# Patient Record
Sex: Female | Born: 1975 | Race: Black or African American | Hispanic: No | Marital: Single | Smoking: Current every day smoker
Health system: Southern US, Community
[De-identification: ages and names within clinical notes are randomized; demographics above are authoritative.]

## PROBLEM LIST (undated history)

## (undated) DIAGNOSIS — F419 Anxiety disorder, unspecified: Secondary | ICD-10-CM

## (undated) DIAGNOSIS — F32A Depression, unspecified: Secondary | ICD-10-CM

## (undated) DIAGNOSIS — F329 Major depressive disorder, single episode, unspecified: Secondary | ICD-10-CM

## (undated) HISTORY — PX: DILATION AND CURETTAGE OF UTERUS: SHX78

---

## 2003-08-14 ENCOUNTER — Emergency Department (HOSPITAL_COMMUNITY): Admission: EM | Admit: 2003-08-14 | Discharge: 2003-08-14 | Payer: Self-pay | Admitting: Family Medicine

## 2003-08-28 ENCOUNTER — Emergency Department (HOSPITAL_COMMUNITY): Admission: EM | Admit: 2003-08-28 | Discharge: 2003-08-28 | Payer: Self-pay | Admitting: Family Medicine

## 2003-10-01 ENCOUNTER — Emergency Department (HOSPITAL_COMMUNITY): Admission: EM | Admit: 2003-10-01 | Discharge: 2003-10-01 | Payer: Self-pay | Admitting: *Deleted

## 2004-01-09 ENCOUNTER — Emergency Department (HOSPITAL_COMMUNITY): Admission: EM | Admit: 2004-01-09 | Discharge: 2004-01-09 | Payer: Self-pay | Admitting: Family Medicine

## 2004-03-18 ENCOUNTER — Emergency Department (HOSPITAL_COMMUNITY): Admission: EM | Admit: 2004-03-18 | Discharge: 2004-03-18 | Payer: Self-pay | Admitting: Family Medicine

## 2004-05-20 ENCOUNTER — Emergency Department (HOSPITAL_COMMUNITY): Admission: EM | Admit: 2004-05-20 | Discharge: 2004-05-20 | Payer: Self-pay | Admitting: Family Medicine

## 2005-05-20 ENCOUNTER — Emergency Department (HOSPITAL_COMMUNITY): Admission: EM | Admit: 2005-05-20 | Discharge: 2005-05-20 | Payer: Self-pay | Admitting: Emergency Medicine

## 2005-09-18 ENCOUNTER — Emergency Department (HOSPITAL_COMMUNITY): Admission: EM | Admit: 2005-09-18 | Discharge: 2005-09-18 | Payer: Self-pay | Admitting: Family Medicine

## 2006-01-04 ENCOUNTER — Emergency Department (HOSPITAL_COMMUNITY): Admission: EM | Admit: 2006-01-04 | Discharge: 2006-01-04 | Payer: Self-pay | Admitting: Family Medicine

## 2006-11-08 ENCOUNTER — Emergency Department (HOSPITAL_COMMUNITY): Admission: EM | Admit: 2006-11-08 | Discharge: 2006-11-08 | Payer: Self-pay | Admitting: Emergency Medicine

## 2007-07-27 ENCOUNTER — Emergency Department (HOSPITAL_COMMUNITY): Admission: EM | Admit: 2007-07-27 | Discharge: 2007-07-27 | Payer: Self-pay | Admitting: Family Medicine

## 2010-11-09 ENCOUNTER — Emergency Department (HOSPITAL_COMMUNITY)
Admission: EM | Admit: 2010-11-09 | Discharge: 2010-11-09 | Disposition: A | Discharge status: Home / Self Care | Payer: PRIVATE HEALTH INSURANCE | Attending: Emergency Medicine | Admitting: Emergency Medicine

## 2010-11-09 DIAGNOSIS — J3489 Other specified disorders of nose and nasal sinuses: Secondary | ICD-10-CM | POA: Insufficient documentation

## 2010-11-09 DIAGNOSIS — R51 Headache: Secondary | ICD-10-CM | POA: Insufficient documentation

## 2010-11-09 DIAGNOSIS — J329 Chronic sinusitis, unspecified: Secondary | ICD-10-CM | POA: Insufficient documentation

## 2010-11-09 LAB — URINALYSIS, ROUTINE W REFLEX MICROSCOPIC
Leukocytes, UA: NEGATIVE
Nitrite: NEGATIVE
Specific Gravity, Urine: 1.009 (ref 1.005–1.030)
Urobilinogen, UA: 0.2 mg/dL (ref 0.0–1.0)
pH: 7.5 (ref 5.0–8.0)

## 2010-11-09 LAB — CBC
Platelets: 256 10*3/uL (ref 150–400)
RBC: 3.81 MIL/uL — ABNORMAL LOW (ref 3.87–5.11)
RDW: 12.5 % (ref 11.5–15.5)
WBC: 5.2 10*3/uL (ref 4.0–10.5)

## 2010-11-09 LAB — DIFFERENTIAL
Basophils Absolute: 0 10*3/uL (ref 0.0–0.1)
Eosinophils Absolute: 0.1 10*3/uL (ref 0.0–0.7)
Eosinophils Relative: 1 % (ref 0–5)
Lymphs Abs: 2.5 10*3/uL (ref 0.7–4.0)
Neutrophils Relative %: 44 % (ref 43–77)

## 2010-11-09 LAB — BASIC METABOLIC PANEL
BUN: 4 mg/dL — ABNORMAL LOW (ref 6–23)
Chloride: 95 mEq/L — ABNORMAL LOW (ref 96–112)
Potassium: 3.6 mEq/L (ref 3.5–5.1)
Sodium: 132 mEq/L — ABNORMAL LOW (ref 135–145)

## 2010-11-09 LAB — POCT PREGNANCY, URINE: Preg Test, Ur: NEGATIVE

## 2011-02-01 LAB — POCT URINALYSIS DIP (DEVICE)
Bilirubin Urine: NEGATIVE
Glucose, UA: NEGATIVE
Nitrite: NEGATIVE
Operator id: 247071
pH: 7

## 2011-02-01 LAB — WET PREP, GENITAL
Clue Cells Wet Prep HPF POC: NONE SEEN
Yeast Wet Prep HPF POC: NONE SEEN

## 2011-12-02 ENCOUNTER — Encounter (HOSPITAL_COMMUNITY): Payer: Self-pay | Admitting: *Deleted

## 2011-12-02 ENCOUNTER — Emergency Department (HOSPITAL_COMMUNITY)
Admission: EM | Admit: 2011-12-02 | Discharge: 2011-12-03 | Disposition: A | Discharge status: Home / Self Care | Payer: Non-veteran care | Attending: Emergency Medicine | Admitting: Emergency Medicine

## 2011-12-02 ENCOUNTER — Emergency Department (HOSPITAL_COMMUNITY): Payer: Non-veteran care

## 2011-12-02 DIAGNOSIS — R109 Unspecified abdominal pain: Secondary | ICD-10-CM | POA: Insufficient documentation

## 2011-12-02 DIAGNOSIS — K625 Hemorrhage of anus and rectum: Secondary | ICD-10-CM

## 2011-12-02 MED ORDER — HYDROMORPHONE HCL PF 1 MG/ML IJ SOLN
1.0000 mg | Freq: Once | INTRAMUSCULAR | Status: DC
  Filled 2011-12-02: qty 1

## 2011-12-02 MED ORDER — ONDANSETRON HCL 4 MG/2ML IJ SOLN
4.0000 mg | Freq: Once | INTRAMUSCULAR | Status: AC
  Administered 2011-12-02: 4 mg via INTRAVENOUS
  Filled 2011-12-02: qty 2

## 2011-12-02 MED ORDER — SODIUM CHLORIDE 0.9 % IV BOLUS (SEPSIS)
1000.0000 mL | Freq: Once | INTRAVENOUS | Status: AC
  Administered 2011-12-02: 1000 mL via INTRAVENOUS

## 2011-12-02 MED ORDER — KETOROLAC TROMETHAMINE 30 MG/ML IJ SOLN
30.0000 mg | Freq: Once | INTRAMUSCULAR | Status: AC
  Administered 2011-12-02: 30 mg via INTRAVENOUS
  Filled 2011-12-02: qty 1

## 2011-12-02 NOTE — ED Notes (Signed)
Nurse visualized stool. Bright red blood with little feces.

## 2011-12-02 NOTE — ED Notes (Signed)
Pt did hydrogen peroxide enema around 7pm; used 3 tbsp hydrogen peroxide to one quart of water; pt had bowel movement x 1 hour; woke up with abd pain; developed bright red blood in stool prior to arrival; hurts to have bowel movement

## 2011-12-03 LAB — CBC WITH DIFFERENTIAL/PLATELET
Basophils Absolute: 0 10*3/uL (ref 0.0–0.1)
Eosinophils Relative: 0 % (ref 0–5)
HCT: 35.3 % — ABNORMAL LOW (ref 36.0–46.0)
Lymphocytes Relative: 5 % — ABNORMAL LOW (ref 12–46)
Lymphs Abs: 0.4 10*3/uL — ABNORMAL LOW (ref 0.7–4.0)
MCH: 28.1 pg (ref 26.0–34.0)
MCV: 84.7 fL (ref 78.0–100.0)
Monocytes Absolute: 0.4 10*3/uL (ref 0.1–1.0)
RDW: 12.5 % (ref 11.5–15.5)
WBC: 9.8 10*3/uL (ref 4.0–10.5)

## 2011-12-03 LAB — COMPREHENSIVE METABOLIC PANEL
ALT: 10 U/L (ref 0–35)
AST: 16 U/L (ref 0–37)
Albumin: 4.2 g/dL (ref 3.5–5.2)
Alkaline Phosphatase: 41 U/L (ref 39–117)
BUN: 11 mg/dL (ref 6–23)
Chloride: 103 mEq/L (ref 96–112)
Potassium: 3.8 mEq/L (ref 3.5–5.1)
Sodium: 136 mEq/L (ref 135–145)
Total Bilirubin: 0.2 mg/dL — ABNORMAL LOW (ref 0.3–1.2)
Total Protein: 6.8 g/dL (ref 6.0–8.3)

## 2011-12-03 MED ORDER — IBUPROFEN 800 MG PO TABS: 800.0000 mg | ORAL_TABLET | Freq: Three times a day (TID) | ORAL | Status: AC

## 2011-12-03 MED ORDER — HYDROCODONE-ACETAMINOPHEN 5-325 MG PO TABS: 1.0000 | ORAL_TABLET | Freq: Four times a day (QID) | ORAL | Status: AC | PRN

## 2011-12-03 NOTE — ED Provider Notes (Signed)
History     CSN: [id number]  Arrival date & time 12/02/11  2200   First MD Initiated Contact with Patient 12/02/11 2301      Chief Complaint  Patient presents with  . Abdominal Pain    (Consider location/radiation/quality/duration/timing/severity/associated sxs/prior treatment) Patient is a 36 y.o. female presenting with abdominal pain. The history is provided by the patient (the pt gave herself an enema and now has abd pain and bleeding). No language interpreter was used.  Abdominal Pain The primary symptoms of the illness include abdominal pain. The primary symptoms of the illness do not include fatigue or diarrhea. The current episode started 3 to 5 hours ago. The onset of the illness was sudden. The problem has been gradually improving.  Associated with: nothing. The patient states that she believes she is currently not pregnant. The patient has not had a change in bowel habit. Risk factors: none. Symptoms associated with the illness do not include chills, hematuria, frequency or back pain.    History reviewed. No pertinent past medical history.  History reviewed. No pertinent past surgical history.  No family history on file.  History  Substance Use Topics  . Smoking status: Never Smoker   . Smokeless tobacco: Not on file  . Alcohol Use:     OB History    Grav Para Term Preterm Abortions TAB SAB Ect Mult Living                  Review of Systems  Constitutional: Negative for chills and fatigue.  HENT: Negative for congestion, sinus pressure and ear discharge.   Eyes: Negative for discharge.  Respiratory: Negative for cough.   Cardiovascular: Negative for chest pain.  Gastrointestinal: Positive for abdominal pain. Negative for diarrhea.       Rectal bleeding  Genitourinary: Negative for frequency and hematuria.  Musculoskeletal: Negative for back pain.  Skin: Negative for rash.  Neurological: Negative for seizures and headaches.  Hematological: Negative.     Psychiatric/Behavioral: Negative for hallucinations.    Allergies  Review of patient's allergies indicates no known allergies.  Home Medications   Current Outpatient Rx  Name Route Sig Dispense Refill  . OVER THE COUNTER MEDICATION Oral Take 1 capsule by mouth. OTC. Herbal Supplement for constapation.    Marland Kitchen HYDROCODONE-ACETAMINOPHEN 5-325 MG PO TABS Oral Take 1 tablet by mouth every 6 (six) hours as needed for pain. 20 tablet 0    BP 97/61  Pulse 84  Temp 98.1 F (36.7 C) (Oral)  Resp 18  SpO2 100%  LMP 12/02/2011  Physical Exam  Constitutional: She is oriented to person, place, and time. She appears well-developed.  HENT:  Head: Normocephalic and atraumatic.  Eyes: Conjunctivae and EOM are normal. No scleral icterus.  Neck: Neck supple. No thyromegaly present.  Cardiovascular: Normal rate and regular rhythm.  Exam reveals no gallop and no friction rub.   No murmur heard. Pulmonary/Chest: No stridor. She has no wheezes. She has no rales. She exhibits no tenderness.  Abdominal: She exhibits no distension. There is tenderness. There is no rebound.       Mild llq tenderness  Musculoskeletal: Normal range of motion. She exhibits no edema.  Lymphadenopathy:    She has no cervical adenopathy.  Neurological: She is oriented to person, place, and time. Coordination normal.  Skin: No rash noted. No erythema.  Psychiatric: She has a normal mood and affect. Her behavior is normal.    ED Course  Procedures (including critical care time)  Labs Reviewed  CBC WITH DIFFERENTIAL - Abnormal; Notable for the following:    Hemoglobin 11.7 (*)     HCT 35.3 (*)     Neutrophils Relative 91 (*)     Neutro Abs 8.9 (*)     Lymphocytes Relative 5 (*)     Lymphs Abs 0.4 (*)     All other components within normal limits  COMPREHENSIVE METABOLIC PANEL - Abnormal; Notable for the following:    Total Bilirubin 0.2 (*)     All other components within normal limits   Dg Abd Acute  W/chest  12/02/2011  *RADIOLOGY REPORT*  Clinical Data: Constipation.  Low abdominal pain.  ACUTE ABDOMEN SERIES (ABDOMEN 2 VIEW & CHEST 1 VIEW)  Comparison: None.  Findings: The heart size and mediastinal contours are normal. The lungs are clear. There is no pleural effusion or pneumothorax. No acute osseous findings are identified.  Colonic stool does not appear significantly increased.  The bowel gas pattern is normal.  There are bilateral pelvic phleboliths.  No pneumoperitoneum or suspicious calcification is identified. Osseous structures appear normal.  IMPRESSION: No active cardiopulmonary or abdominal process identified.  Original Report Authenticated By: Gerrianne Scale, M.D.     1. Rectal bleeding       MDM          Benny Lennert, MD 12/03/11 [phone]

## 2012-12-20 ENCOUNTER — Emergency Department (HOSPITAL_BASED_OUTPATIENT_CLINIC_OR_DEPARTMENT_OTHER): Payer: Non-veteran care

## 2012-12-20 ENCOUNTER — Encounter (HOSPITAL_BASED_OUTPATIENT_CLINIC_OR_DEPARTMENT_OTHER): Payer: Self-pay | Admitting: *Deleted

## 2012-12-20 ENCOUNTER — Emergency Department (HOSPITAL_BASED_OUTPATIENT_CLINIC_OR_DEPARTMENT_OTHER)
Admission: EM | Admit: 2012-12-20 | Discharge: 2012-12-20 | Disposition: A | Discharge status: Home / Self Care | Payer: Non-veteran care | Attending: Emergency Medicine | Admitting: Emergency Medicine

## 2012-12-20 DIAGNOSIS — R202 Paresthesia of skin: Secondary | ICD-10-CM

## 2012-12-20 DIAGNOSIS — E119 Type 2 diabetes mellitus without complications: Secondary | ICD-10-CM | POA: Insufficient documentation

## 2012-12-20 DIAGNOSIS — F17200 Nicotine dependence, unspecified, uncomplicated: Secondary | ICD-10-CM | POA: Insufficient documentation

## 2012-12-20 DIAGNOSIS — R5381 Other malaise: Secondary | ICD-10-CM | POA: Insufficient documentation

## 2012-12-20 DIAGNOSIS — Z8659 Personal history of other mental and behavioral disorders: Secondary | ICD-10-CM | POA: Insufficient documentation

## 2012-12-20 DIAGNOSIS — R5383 Other fatigue: Secondary | ICD-10-CM | POA: Insufficient documentation

## 2012-12-20 DIAGNOSIS — R209 Unspecified disturbances of skin sensation: Secondary | ICD-10-CM | POA: Insufficient documentation

## 2012-12-20 HISTORY — DX: Major depressive disorder, single episode, unspecified: F32.9

## 2012-12-20 HISTORY — DX: Depression, unspecified: F32.A

## 2012-12-20 HISTORY — DX: Anxiety disorder, unspecified: F41.9

## 2012-12-20 LAB — BASIC METABOLIC PANEL
CO2: 22 mEq/L (ref 19–32)
Calcium: 10 mg/dL (ref 8.4–10.5)
Creatinine, Ser: 0.6 mg/dL (ref 0.50–1.10)
GFR calc non Af Amer: >90 mL/min (ref >90)
Sodium: 137 mEq/L (ref 135–145)

## 2012-12-20 LAB — CBC WITH DIFFERENTIAL/PLATELET
Basophils Absolute: 0.1 10*3/uL (ref 0.0–0.1)
Eosinophils Absolute: 0.1 10*3/uL (ref 0.0–0.7)
Eosinophils Relative: 1 % (ref 0–5)
HCT: 33.8 % — ABNORMAL LOW (ref 36.0–46.0)
Lymphocytes Relative: 30 % (ref 12–46)
MCH: 29 pg (ref 26.0–34.0)
MCHC: 34.6 g/dL (ref 30.0–36.0)
MCV: 83.9 fL (ref 78.0–100.0)
Monocytes Absolute: 0.7 10*3/uL (ref 0.1–1.0)
Platelets: 234 10*3/uL (ref 150–400)
RDW: 12.9 % (ref 11.5–15.5)

## 2012-12-20 MED ORDER — TRAMADOL HCL 50 MG PO TABS: 50.0000 mg | ORAL_TABLET | Freq: Four times a day (QID) | ORAL | Status: DC | PRN

## 2012-12-20 MED ORDER — IBUPROFEN 800 MG PO TABS
800.0000 mg | ORAL_TABLET | Freq: Once | ORAL | Status: AC
  Administered 2012-12-20: 800 mg via ORAL
  Filled 2012-12-20: qty 1

## 2012-12-20 MED ORDER — TRAMADOL HCL 50 MG PO TABS
50.0000 mg | ORAL_TABLET | Freq: Once | ORAL | Status: AC
  Administered 2012-12-20: 50 mg via ORAL
  Filled 2012-12-20: qty 1

## 2012-12-20 NOTE — ED Notes (Signed)
Pt reports that she started having right sided numbness about 1 week ago. Pt also reports memory loss and weakness to the right leg.

## 2012-12-20 NOTE — ED Notes (Signed)
Pt returned from CT. Ambulating unassisted and with steady gait.

## 2012-12-20 NOTE — ED Provider Notes (Signed)
CSN: [id number]     Arrival date & time 12/20/12  1818 History     First MD Initiated Contact with Patient 12/20/12 1908     Chief Complaint  Patient presents with  . Numbness   (Consider location/radiation/quality/duration/timing/severity/associated sxs/prior Treatment) HPI Comments: Patient is a 37 year old female with a past medical history of anxiety, depression, and diabetes who presents with a 1 week history of right side numbness and weakness. Symptoms started gradually and remained constant since the onset. Patient further describes the paresthesias of the right posterior aspect of neck, right lower back, and lateral right lower leg. Patient became concerned because her right leg "gave out" on her while at work today. Patient has not tried anything for symptoms. No aggravating/alleviating factors. Patient denies family history of MS.    Past Medical History  Diagnosis Date  . Anxiety   . Depression   . Diabetes mellitus without complication    Past Surgical History  Procedure Laterality Date  . Dilation and curettage of uterus     History reviewed. No pertinent family history. History  Substance Use Topics  . Smoking status: Current Every Day Smoker -- 0.50 packs/day  . Smokeless tobacco: Not on file  . Alcohol Use: No   OB History   Grav Para Term Preterm Abortions TAB SAB Ect Mult Living                 Review of Systems  Neurological: Positive for weakness and numbness.  All other systems reviewed and are negative.    Allergies  Review of patient's allergies indicates no known allergies.  Home Medications   Current Outpatient Rx  Name  Route  Sig  Dispense  Refill  . OVER THE COUNTER MEDICATION   Oral   Take 1 capsule by mouth. OTC. Herbal Supplement for constapation.          BP 143/87  Pulse 91  Temp(Src) 98.9 F (37.2 C) (Oral)  Resp 20  Ht 5\' 4"  (1.626 m)  Wt 110 lb (49.896 kg)  BMI 18.87 kg/m2  SpO2 100%  LMP 11/26/2012 Physical Exam   Nursing note and vitals reviewed. Constitutional: She is oriented to person, place, and time. She appears well-developed and well-nourished. No distress.  HENT:  Head: Normocephalic and atraumatic.  Mouth/Throat: Oropharynx is clear and moist. No oropharyngeal exudate.  Eyes: Conjunctivae and EOM are normal.  Neck: Normal range of motion.  Cardiovascular: Normal rate and regular rhythm.  Exam reveals no gallop and no friction rub.   No murmur heard. Pulmonary/Chest: Effort normal and breath sounds normal. She has no wheezes. She has no rales. She exhibits no tenderness.  Abdominal: Soft. She exhibits no distension. There is no tenderness. There is no rebound and no guarding.  Musculoskeletal: Normal range of motion.  Neurological: She is alert and oriented to person, place, and time. No cranial nerve deficit. Coordination normal.  Right lateral leg diminished sensation. Extremity strength equal and intact bilaterally. Speech is goal-oriented. Moves limbs without ataxia.   Skin: Skin is warm and dry.  Psychiatric: She has a normal mood and affect. Her behavior is normal.    ED Course   Procedures (including critical care time)  Labs Reviewed  CBC WITH DIFFERENTIAL - Abnormal; Notable for the following:    Hemoglobin 11.7 (*)    HCT 33.8 (*)    All other components within normal limits  BASIC METABOLIC PANEL - Abnormal; Notable for the following:    Glucose,  Bld 112 (*)    All other components within normal limits   Ct Head Wo Contrast  12/20/2012   *RADIOLOGY REPORT*  Clinical Data: Right-sided facial numbness  CT HEAD WITHOUT CONTRAST  Technique:  Contiguous axial images were obtained from the base of the skull through the vertex without contrast.  Comparison: 11/08/2006  Findings: No acute hemorrhage, acute infarction, or mass lesion is identified.  No midline shift.  No ventriculomegaly.  No skull fracture.  Orbits and paranasal sinuses are intact.  No soft tissue abnormality.   IMPRESSION: No acute intracranial finding.  No change.   Original Report Authenticated By: Christiana Pellant, M.D.   1. Paresthesias with subjective weakness     MDM  7:19 PM Patient will have labs, head CT and ibuprofen for back pain. Vitals stable and patient afebrile. Patient has diminished sensation over lateral right leg at this time without other neuro deficits.   8:50 PM Patient's labs and heat CT unremarkable for acute changes. I'm concerned for MS in the patient. I spoke with Dr. Thad Ranger with Neurology about a follow up plan for the patient since MRI is not available at [facility] tonight. Dr. Thad Ranger is OK with the patient having an MRI as an outpatient at the [facility]. If the patient is unable to follow up in a reasonable time frame, patient will go to [facility]. Patient is ok with outpatient follow up. Patient not having weakness or bladder/bowel incontinence. Vitals stable and patient afebrile. Patience Musca, PA-C 12/20/12 2122

## 2012-12-20 NOTE — ED Notes (Signed)
Patient transported to CT 

## 2012-12-21 NOTE — ED Provider Notes (Signed)
Medical screening examination/treatment/procedure(s) were conducted as a shared visit with non-physician practitioner(s) and myself.  I personally evaluated the patient during the encounter  RLE paresthesias, R shoulder paresthesias.  CN 2-12 intact, no ataxia on finger to nose, no nystagmus, 5/5 strength throughout, no pronator drift, Romberg negative, normal gait. Consider MS.  Glynn Octave, MD 12/21/12 [phone]

## 2013-04-11 ENCOUNTER — Emergency Department (HOSPITAL_BASED_OUTPATIENT_CLINIC_OR_DEPARTMENT_OTHER)
Admission: EM | Admit: 2013-04-11 | Discharge: 2013-04-11 | Disposition: A | Discharge status: Home / Self Care | Payer: Non-veteran care | Attending: Emergency Medicine | Admitting: Emergency Medicine

## 2013-04-11 ENCOUNTER — Encounter (HOSPITAL_BASED_OUTPATIENT_CLINIC_OR_DEPARTMENT_OTHER): Payer: Self-pay | Admitting: Emergency Medicine

## 2013-04-11 DIAGNOSIS — F17200 Nicotine dependence, unspecified, uncomplicated: Secondary | ICD-10-CM | POA: Insufficient documentation

## 2013-04-11 DIAGNOSIS — Z3202 Encounter for pregnancy test, result negative: Secondary | ICD-10-CM | POA: Insufficient documentation

## 2013-04-11 DIAGNOSIS — R5381 Other malaise: Secondary | ICD-10-CM | POA: Insufficient documentation

## 2013-04-11 DIAGNOSIS — Z8659 Personal history of other mental and behavioral disorders: Secondary | ICD-10-CM | POA: Insufficient documentation

## 2013-04-11 DIAGNOSIS — R42 Dizziness and giddiness: Secondary | ICD-10-CM | POA: Insufficient documentation

## 2013-04-11 LAB — CBC WITH DIFFERENTIAL/PLATELET
Eosinophils Absolute: 0 10*3/uL (ref 0.0–0.7)
Lymphocytes Relative: 14 % (ref 12–46)
Lymphs Abs: 1.2 10*3/uL (ref 0.7–4.0)
MCH: 28.9 pg (ref 26.0–34.0)
MCV: 85.8 fL (ref 78.0–100.0)
Neutro Abs: 6.9 10*3/uL (ref 1.7–7.7)
Neutrophils Relative %: 80 % — ABNORMAL HIGH (ref 43–77)
Platelets: 248 10*3/uL (ref 150–400)
RBC: 4.36 MIL/uL (ref 3.87–5.11)
WBC: 8.6 10*3/uL (ref 4.0–10.5)

## 2013-04-11 LAB — BASIC METABOLIC PANEL
CO2: 24 mEq/L (ref 19–32)
Chloride: 96 mEq/L (ref 96–112)
Creatinine, Ser: 0.6 mg/dL (ref 0.50–1.10)
GFR calc Af Amer: >90 mL/min (ref >90)
GFR calc non Af Amer: >90 mL/min (ref >90)
Glucose, Bld: 74 mg/dL (ref 70–99)
Potassium: 4.1 mEq/L (ref 3.5–5.1)
Sodium: 132 mEq/L — ABNORMAL LOW (ref 135–145)

## 2013-04-11 LAB — URINALYSIS, ROUTINE W REFLEX MICROSCOPIC
Glucose, UA: NEGATIVE mg/dL
Hgb urine dipstick: NEGATIVE
Ketones, ur: 15 mg/dL — AB
Leukocytes, UA: NEGATIVE
Protein, ur: NEGATIVE mg/dL
Urobilinogen, UA: 0.2 mg/dL (ref 0.0–1.0)

## 2013-04-11 LAB — PREGNANCY, URINE: Preg Test, Ur: NEGATIVE

## 2013-04-11 MED ORDER — SODIUM CHLORIDE 0.9 % IV SOLN
Freq: Once | INTRAVENOUS | Status: AC
  Administered 2013-04-11: 16:00:00 via INTRAVENOUS

## 2013-04-11 NOTE — ED Provider Notes (Signed)
Medical screening examination/treatment/procedure(s) were performed by non-physician practitioner and as supervising physician I was immediately available for consultation/collaboration.  EKG Interpretation   None         Josilynn Losh, MD 04/11/13 2345 

## 2013-04-11 NOTE — ED Provider Notes (Signed)
CSN: [id number]     Arrival date & time 04/11/13  1437 History   First MD Initiated Contact with Patient 04/11/13 1534     Chief Complaint  Patient presents with  . Dizziness   (Consider location/radiation/quality/duration/timing/severity/associated sxs/prior Treatment) Patient is a 37 y.o. female presenting with weakness. The history is provided by the patient. No language interpreter was used.  Weakness This is a new problem. The current episode started today. The problem occurs constantly. The problem has been unchanged. Associated symptoms include weakness. Nothing aggravates the symptoms. She has tried nothing for the symptoms. The treatment provided moderate relief.   Pt reports she felt dizziness today at work.  Pt reports she felt like she was going to pass out.  Past Medical History  Diagnosis Date  . Anxiety   . Depression    Past Surgical History  Procedure Laterality Date  . Dilation and curettage of uterus     No family history on file. History  Substance Use Topics  . Smoking status: Current Every Day Smoker -- 0.50 packs/day  . Smokeless tobacco: Not on file  . Alcohol Use: No   OB History   Grav Para Term Preterm Abortions TAB SAB Ect Mult Living                 Review of Systems  Neurological: Positive for weakness.  All other systems reviewed and are negative.    Allergies  Review of patient's allergies indicates no known allergies.  Home Medications  No current outpatient prescriptions on file. BP 127/90  Pulse 99  Temp(Src) 98.6 F (37 C) (Oral)  Resp 16  Ht 5\' 4"  (1.626 m)  Wt 115 lb (52.164 kg)  BMI 19.73 kg/m2  SpO2 100%  LMP 04/02/2013 Physical Exam  Nursing note and vitals reviewed. Constitutional: She appears well-developed and well-nourished.  HENT:  Head: Normocephalic.  Right Ear: External ear normal.  Left Ear: External ear normal.  Eyes: Pupils are equal, round, and reactive to light.  Neck: Normal range of motion.   Cardiovascular: Normal rate, regular rhythm and normal heart sounds.   Pulmonary/Chest: Effort normal and breath sounds normal.  Abdominal: Soft. Bowel sounds are normal.  Musculoskeletal: Normal range of motion.  Neurological: She is alert.  Skin: Skin is warm.  Psychiatric: She has a normal mood and affect.    ED Course  Procedures (including critical care time) Labs Review Labs Reviewed  URINALYSIS, ROUTINE W REFLEX MICROSCOPIC  PREGNANCY, URINE  CBC WITH DIFFERENTIAL  BASIC METABOLIC PANEL   Imaging Review No results found.  EKG Interpretation   None       MDM  No diagnosis found. Results for orders placed during the hospital encounter of 04/11/13  URINALYSIS, ROUTINE W REFLEX MICROSCOPIC      Result Value Range   Color, Urine YELLOW  YELLOW   APPearance CLEAR  CLEAR   Specific Gravity, Urine 1.011  1.005 - 1.030   pH 6.0  5.0 - 8.0   Glucose, UA NEGATIVE  NEGATIVE mg/dL   Hgb urine dipstick NEGATIVE  NEGATIVE   Bilirubin Urine NEGATIVE  NEGATIVE   Ketones, ur 15 (*) NEGATIVE mg/dL   Protein, ur NEGATIVE  NEGATIVE mg/dL   Urobilinogen, UA 0.2  0.0 - 1.0 mg/dL   Nitrite NEGATIVE  NEGATIVE   Leukocytes, UA NEGATIVE  NEGATIVE  PREGNANCY, URINE      Result Value Range   Preg Test, Ur NEGATIVE  NEGATIVE  CBC WITH DIFFERENTIAL  Result Value Range   WBC 8.6  4.0 - 10.5 K/uL   RBC 4.36  3.87 - 5.11 MIL/uL   Hemoglobin 12.6  12.0 - 15.0 g/dL   HCT 62.9  52.8 - 41.3 %   MCV 85.8  78.0 - 100.0 fL   MCH 28.9  26.0 - 34.0 pg   MCHC 33.7  30.0 - 36.0 g/dL   RDW 24.4  01.0 - 27.2 %   Platelets 248  150 - 400 K/uL   Neutrophils Relative % 80 (*) 43 - 77 %   Neutro Abs 6.9  1.7 - 7.7 K/uL   Lymphocytes Relative 14  12 - 46 %   Lymphs Abs 1.2  0.7 - 4.0 K/uL   Monocytes Relative 6  3 - 12 %   Monocytes Absolute 0.5  0.1 - 1.0 K/uL   Eosinophils Relative 0  0 - 5 %   Eosinophils Absolute 0.0  0.0 - 0.7 K/uL   Basophils Relative 1  0 - 1 %   Basophils Absolute  0.0  0.0 - 0.1 K/uL  BASIC METABOLIC PANEL      Result Value Range   Sodium 132 (*) 135 - 145 mEq/L   Potassium 4.1  3.5 - 5.1 mEq/L   Chloride 96  96 - 112 mEq/L   CO2 24  19 - 32 mEq/L   Glucose, Bld 74  70 - 99 mg/dL   BUN 5 (*) 6 - 23 mg/dL   Creatinine, Ser 5.36  0.50 - 1.10 mg/dL   Calcium 9.6  8.4 - 64.4 mg/dL   GFR calc non Af Amer >90  >90 mL/min   GFR calc Af Amer >90  >90 mL/min   No results found.   Pt given Iv fluids x one liter,   Labs reviewed.   Pt advised recheck with her MD.  Elson Areas, PA-C 04/11/13 1703

## 2013-04-11 NOTE — ED Notes (Signed)
Pt c/o dizziness since Saturday.  Felt somewhat better Sunday but symptoms returned today.  Some nausea.  No known fever.  No visual changes.  No dysuria.

## 2013-04-19 ENCOUNTER — Encounter (HOSPITAL_BASED_OUTPATIENT_CLINIC_OR_DEPARTMENT_OTHER): Payer: Self-pay | Admitting: Emergency Medicine

## 2013-04-19 ENCOUNTER — Emergency Department (HOSPITAL_BASED_OUTPATIENT_CLINIC_OR_DEPARTMENT_OTHER)
Admission: EM | Admit: 2013-04-19 | Discharge: 2013-04-19 | Disposition: A | Discharge status: Home / Self Care | Payer: Non-veteran care | Attending: Emergency Medicine | Admitting: Emergency Medicine

## 2013-04-19 DIAGNOSIS — F17200 Nicotine dependence, unspecified, uncomplicated: Secondary | ICD-10-CM | POA: Insufficient documentation

## 2013-04-19 DIAGNOSIS — Z8659 Personal history of other mental and behavioral disorders: Secondary | ICD-10-CM | POA: Insufficient documentation

## 2013-04-19 DIAGNOSIS — J3489 Other specified disorders of nose and nasal sinuses: Secondary | ICD-10-CM | POA: Insufficient documentation

## 2013-04-19 DIAGNOSIS — R0981 Nasal congestion: Secondary | ICD-10-CM

## 2013-04-19 MED ORDER — OXYMETAZOLINE HCL 0.05 % NA SOLN
2.0000 | Freq: Two times a day (BID) | NASAL | Status: DC | PRN
  Administered 2013-04-19: 2 via NASAL
  Filled 2013-04-19: qty 15

## 2013-04-19 NOTE — ED Notes (Signed)
C/o facial pain, nasal congestion, and head pressure since yesterday.

## 2013-04-19 NOTE — ED Provider Notes (Signed)
CSN: [id number]     Arrival date & time 04/19/13  0601 History   First MD Initiated Contact with Patient 04/19/13 [phone]     Chief Complaint  Patient presents with  . Facial Pain   (Consider location/radiation/quality/duration/timing/severity/associated sxs/prior Treatment) HPI This is a 37 year old female who awoke with sinus pressure, mild to moderate facial pain and nasal congestion this morning. She has not had a fever. She has not taken any medication for this.  Past Medical History  Diagnosis Date  . Anxiety   . Depression    Past Surgical History  Procedure Laterality Date  . Dilation and curettage of uterus     History reviewed. No pertinent family history. History  Substance Use Topics  . Smoking status: Current Every Day Smoker -- 0.50 packs/day  . Smokeless tobacco: Not on file  . Alcohol Use: No   OB History   Grav Para Term Preterm Abortions TAB SAB Ect Mult Living                 Review of Systems  All other systems reviewed and are negative.    Allergies  Review of patient's allergies indicates no known allergies.  Home Medications  No current outpatient prescriptions on file. BP 131/80  Pulse 95  Temp(Src) 99.1 F (37.3 C) (Oral)  Resp 16  Ht 5\' 4"  (1.626 m)  Wt 112 lb (50.803 kg)  BMI 19.22 kg/m2  SpO2 100%  LMP 04/02/2013  Physical Exam General: Well-developed, well-nourished female in no acute distress; appearance consistent with age of record HENT: normocephalic; atraumatic; mild nasal congestion; no tenderness on percussion of sinuses; no pharyngeal erythema or exudate Eyes: pupils equal, round and reactive to light; extraocular muscles intact Neck: supple Heart: regular rate and rhythm Lungs: clear to auscultation bilaterally Abdomen: soft; nondistended Extremities: No deformity; full range of motion Neurologic: Awake, alert and oriented; motor function intact in all extremities and symmetric; no facial droop Skin: Warm and  dry Psychiatric: Normal mood and affect    ED Course  Procedures (including critical care time)  MDM  Patient was advised that at this stage antibiotics are not indicated. We will treat with decongestants.    Hanley Seamen, MD 04/19/13 [phone]

## 2013-04-19 NOTE — ED Notes (Signed)
MD at bedside. 

## 2013-06-25 ENCOUNTER — Emergency Department (INDEPENDENT_AMBULATORY_CARE_PROVIDER_SITE_OTHER)
Admission: EM | Admit: 2013-06-25 | Discharge: 2013-06-25 | Disposition: A | Discharge status: Home / Self Care | Payer: Self-pay | Source: Home / Self Care | Attending: Family Medicine | Admitting: Family Medicine

## 2013-06-25 ENCOUNTER — Encounter (HOSPITAL_COMMUNITY): Payer: Self-pay | Admitting: Emergency Medicine

## 2013-06-25 DIAGNOSIS — J029 Acute pharyngitis, unspecified: Secondary | ICD-10-CM

## 2013-06-25 LAB — POCT RAPID STREP A: STREPTOCOCCUS, GROUP A SCREEN (DIRECT): NEGATIVE

## 2013-06-25 MED ORDER — IPRATROPIUM BROMIDE 0.06 % NA SOLN: 2.0000 | Freq: Four times a day (QID) | NASAL | Status: AC

## 2013-06-25 MED ORDER — PREDNISONE 10 MG PO TABS: 30.0000 mg | ORAL_TABLET | Freq: Every day | ORAL | Status: AC

## 2013-06-25 NOTE — Discharge Instructions (Signed)
Thank you for coming in today. Take Tylenol for pain. His prednisone daily for 5 days. Use Atrovent nasal spray as needed. Call or go to the emergency room if you get worse, have trouble breathing, have chest pains, or palpitations.   Viral Pharyngitis Viral pharyngitis is a viral infection that produces redness, pain, and swelling (inflammation) of the throat. It can spread from person to person (contagious). CAUSES Viral pharyngitis is caused by inhaling a large amount of certain germs called viruses. Many different viruses cause viral pharyngitis. SYMPTOMS Symptoms of viral pharyngitis include:  Sore throat.  Tiredness.  Stuffy nose.  Low-grade fever.  Congestion.  Cough. TREATMENT Treatment includes rest, drinking plenty of fluids, and the use of over-the-counter medication (approved by your caregiver). HOME CARE INSTRUCTIONS   Drink enough fluids to keep your urine clear or pale yellow.  Eat soft, cold foods such as ice cream, frozen ice pops, or gelatin dessert.  Gargle with warm salt water (1 tsp salt per 1 qt of water).  If over age 547, throat lozenges may be used safely.  Only take over-the-counter or prescription medicines for pain, discomfort, or fever as directed by your caregiver. Do not take aspirin. To help prevent spreading viral pharyngitis to others, avoid:  Mouth-to-mouth contact with others.  Sharing utensils for eating and drinking.  Coughing around others. SEEK MEDICAL CARE IF:   You are better in a few days, then become worse.  You have a fever or pain not helped by pain medicines.  There are any other changes that concern you. Document Released: 02/03/2005 Document Revised: 07/19/2011 Document Reviewed: 07/02/2010 [facility] Patient Information 2014 [place], [place].

## 2013-06-25 NOTE — ED Notes (Signed)
Pt c/o sore throat onset last night Sxs include: congestion, runny nose, weakness Denies f/v/n/d, SOB, wheezing Alert w/no signs of acute distress.

## 2013-06-25 NOTE — ED Provider Notes (Signed)
Veronica Davila is a 38 y.o. female who presents to Urgent Care today for sore throat sneezing congestion right ear pain and fatigue. This is been present for one day. Patient has tried Mucinex which has not helped much. No fevers or chills nausea vomiting or diarrhea. She feels well otherwise   Past Medical History  Diagnosis Date  . Anxiety   . Depression    History  Substance Use Topics  . Smoking status: Current Every Day Smoker -- 0.50 packs/day    Types: Cigarettes  . Smokeless tobacco: Not on file  . Alcohol Use: No   ROS as above Medications: No current facility-administered medications for this encounter.   Current Outpatient Prescriptions  Medication Sig Dispense Refill  . ipratropium (ATROVENT) 0.06 % nasal spray Place 2 sprays into both nostrils 4 (four) times daily.  15 mL  1  . predniSONE (DELTASONE) 10 MG tablet Take 3 tablets (30 mg total) by mouth daily.  15 tablet  0    Exam:  BP 127/81  Pulse 90  Temp(Src) 98.9 F (37.2 C) (Oral)  Resp 16  SpO2 100%  LMP 05/27/2013 Gen: Well NAD HEENT: EOMI,  MMM posterior pharynx with cobblestoning. Tympanic membranes are normal appearing bilaterally. Lungs: Normal work of breathing. CTABL Heart: RRR no MRG Abd: NABS, Soft. NT, ND Exts: Brisk capillary refill, warm and well perfused.   Results for orders placed during the hospital encounter of 06/25/13 (from the past 24 hour(s))  POCT RAPID STREP A ([facility]RG CARE ONLY)     Status: None   Collection Time    06/25/13  5:42 PM      Result Value Ref Range   Streptococcus, Group A Screen (Direct) NEGATIVE  NEGATIVE   No results found.  Assessment and Plan: 38 y.o. female with viral URI. Patient has component of viral pharyngitis as well. Will use prednisone and Atrovent nasal spray. Continue Tylenol. Followup with primary care provider as needed. Work note provided.  Discussed warning signs or symptoms. Please see discharge instructions. Patient expresses  understanding.    Rodolph BongEvan S Aidenjames Heckmann, MD 06/25/13 [phone]

## 2013-06-27 ENCOUNTER — Emergency Department (INDEPENDENT_AMBULATORY_CARE_PROVIDER_SITE_OTHER)
Admission: EM | Admit: 2013-06-27 | Discharge: 2013-06-27 | Disposition: A | Discharge status: Home / Self Care | Payer: Self-pay | Source: Home / Self Care

## 2013-06-27 ENCOUNTER — Encounter (HOSPITAL_COMMUNITY): Payer: Self-pay | Admitting: Emergency Medicine

## 2013-06-27 DIAGNOSIS — J40 Bronchitis, not specified as acute or chronic: Secondary | ICD-10-CM

## 2013-06-27 DIAGNOSIS — J069 Acute upper respiratory infection, unspecified: Secondary | ICD-10-CM

## 2013-06-27 DIAGNOSIS — J329 Chronic sinusitis, unspecified: Secondary | ICD-10-CM

## 2013-06-27 MED ORDER — AMOXICILLIN-POT CLAVULANATE 875-125 MG PO TABS: 1.0000 | ORAL_TABLET | Freq: Two times a day (BID) | ORAL | Status: DC

## 2013-06-27 NOTE — ED Notes (Signed)
Pt c/o persistent cold sxs onset 2 days Reports she was seen here on 2/16; given prednisone and Atrovent nasal spray Sxs today include: cough, HA, chest tightness, nauseas, diarrhea Alert w/no signs of acute distress.

## 2013-06-27 NOTE — ED Provider Notes (Signed)
CSN: [id number]     Arrival date & time 06/27/13  1420 History   None    Chief Complaint  Patient presents with  . URI     (Consider location/radiation/quality/duration/timing/severity/associated sxs/prior Treatment) HPI Runny nose, sore throat, ear pain present for 3 days and pt came to [facility] and seen by Dr. Denyse Amassorey and Dx w/ viral URI. Now w/ cough and HA and feeling lethargic. Stayed in bed most of the day yesterday and today. Denies fevers, CP, and SOB,  n/v/d. Multiple sick contacts. Atrovent w/ some benefit but burns nose. Tylenol 325mg  w/ benefit.  Feels like infection has moved into mid chest  Past Medical History  Diagnosis Date  . Anxiety   . Depression    Past Surgical History  Procedure Laterality Date  . Dilation and curettage of uterus     No family history on file. History  Substance Use Topics  . Smoking status: Current Every Day Smoker -- 0.50 packs/day    Types: Cigarettes  . Smokeless tobacco: Not on file  . Alcohol Use: No   OB History   Grav Para Term Preterm Abortions TAB SAB Ect Mult Living                 Review of Systems  Constitutional: Positive for activity change and fatigue. Negative for fever and chills.  Respiratory: Negative for chest tightness, shortness of breath and wheezing.   Cardiovascular: Negative for chest pain.  All other systems reviewed and are negative.      Allergies  Review of patient's allergies indicates no known allergies.  Home Medications   Current Outpatient Rx  Name  Route  Sig  Dispense  Refill  . amoxicillin-clavulanate (AUGMENTIN) 875-125 MG per tablet   Oral   Take 1 tablet by mouth 2 (two) times daily.   20 tablet   0   . ipratropium (ATROVENT) 0.06 % nasal spray   Each Nare   Place 2 sprays into both nostrils 4 (four) times daily.   15 mL   1   . predniSONE (DELTASONE) 10 MG tablet   Oral   Take 3 tablets (30 mg total) by mouth daily.   15 tablet   0    BP 115/79  Pulse 90  Temp(Src) 99.3  F (37.4 C) (Oral)  Resp 18  SpO2 98%  LMP 05/27/2013 Physical Exam  Constitutional: She is oriented to person, place, and time. She appears well-developed and well-nourished. No distress.  HENT:  Head: Normocephalic.  Maxillary sinuses ttp bilat, phayringeal cobblestoning   Neck: Normal range of motion.  Cardiovascular: Normal rate, normal heart sounds and intact distal pulses.  Exam reveals no gallop.   No murmur heard. Pulmonary/Chest: Effort normal and breath sounds normal. No respiratory distress. She has no wheezes. She has no rales. She exhibits no tenderness.  Abdominal: Soft. She exhibits no distension.  Musculoskeletal: Normal range of motion. She exhibits no edema and no tenderness.  Lymphadenopathy:    She has cervical adenopathy.  Neurological: She is alert and oriented to person, place, and time.  Skin: Skin is warm. No rash noted. She is not diaphoretic.  Psychiatric: She has a normal mood and affect. Her behavior is normal. Judgment and thought content normal.    ED Course  Procedures (including critical care time) Labs Review Labs Reviewed - No data to display Imaging Review No results found.    MDM   Final diagnoses:  URI (upper respiratory infection)  Sinusitis  Bronchitis  38yo w/ worsening URI symptoms now w/ addisiotnal sinusitis and bronchitis symptoms. Discussed viral vs bacterial infection. Pt needs to maximize therapy including Ibuprofen 600mg  Q6 along w/ increased tylenol and MucinexD adn atrovent. Augmentin Rx given to only be used if pt condition worsens or does not improve by the weekend.  precautions given and all questions answered  Shelly Flatten, MD Family Medicine PGY-3 06/27/2013, 5:25 PM      Ozella Rocks, MD 06/27/13 [phone]

## 2013-06-27 NOTE — Discharge Instructions (Signed)
You are doing well overall you likely have a viral infection that may be turning into a bacteral infection Please take the antibiotics if you are no better by Friday or getting worse Please start ibuprofen 600mg  every 6 hours along with 650mg  of tylenol Please also start extra strength mucinex D along with the atrovent  Bronchitis Bronchitis is swelling (inflammation) of the air tubes leading to your lungs (bronchi). This causes mucus and a cough. If the swelling gets bad, you may have trouble breathing. HOME CARE   Rest.  Drink enough fluids to keep your pee (urine) clear or pale yellow (unless you have a condition where you have to watch how much you drink).  Only take medicine as told by your doctor. If you were given antibiotic medicines, finish them even if you start to feel better.  Avoid smoke, irritating chemicals, and strong smells. These make the problem worse. Quit smoking if you smoke. This helps your lungs heal faster.  Use a cool mist humidifier. Change the water in the humidifier every day. You can also sit in the bathroom with hot shower running for 5 10 minutes. Keep the door closed.  See your health care provider as told.  Wash your hands often. GET HELP IF: Your problems do not get better after 1 week. GET HELP RIGHT AWAY IF:   Your fever gets worse.  You have chills.  Your chest hurts.  Your problems breathing get worse.  You have blood in your mucus.  You pass out (faint).  You feel lightheaded.  You have a bad headache.  You throw up (vomit) again and again. MAKE SURE YOU:  Understand these instructions.  Will watch your condition.  Will get help right away if you are not doing well or get worse. Document Released: 10/13/2007 Document Revised: 02/14/2013 Document Reviewed: 12/19/2012 [facility] Patient Information 2014 [employer], [place].

## 2013-06-27 NOTE — ED Provider Notes (Signed)
Medical screening examination/treatment/procedure(s) were performed by resident physician or non-physician practitioner and as supervising physician I was immediately available for consultation/collaboration.   Akaylah Lalley DOUGLAS MD.   Renesmee Raine D Rayan Dyal, MD 06/27/13 2009 

## 2013-06-28 LAB — CULTURE, GROUP A STREP

## 2013-08-19 IMAGING — CR DG ABDOMEN ACUTE W/ 1V CHEST
3 series · 3 of 3 positions shown · non-contrast
Comparison: None.

CLINICAL DATA: Constipation.  Low abdominal pain.

ACUTE ABDOMEN SERIES (ABDOMEN 2 VIEW & CHEST 1 VIEW)

[w chest pa]
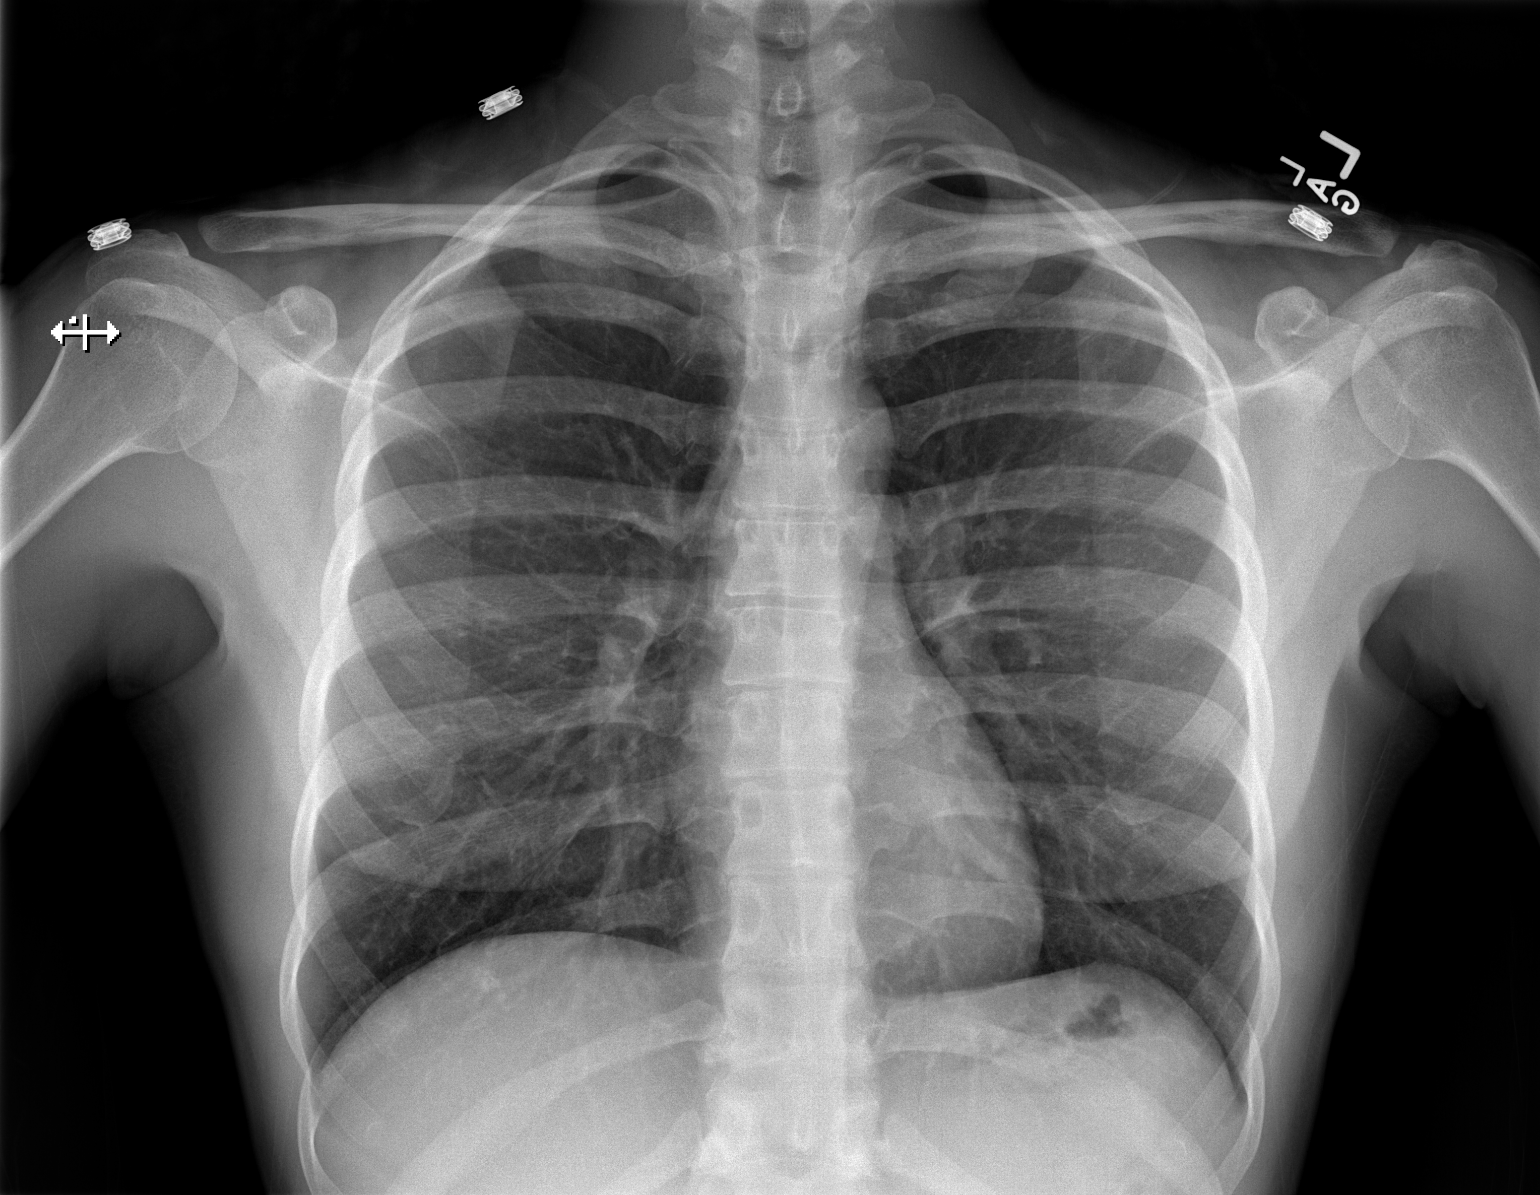

[w abdomen upright]
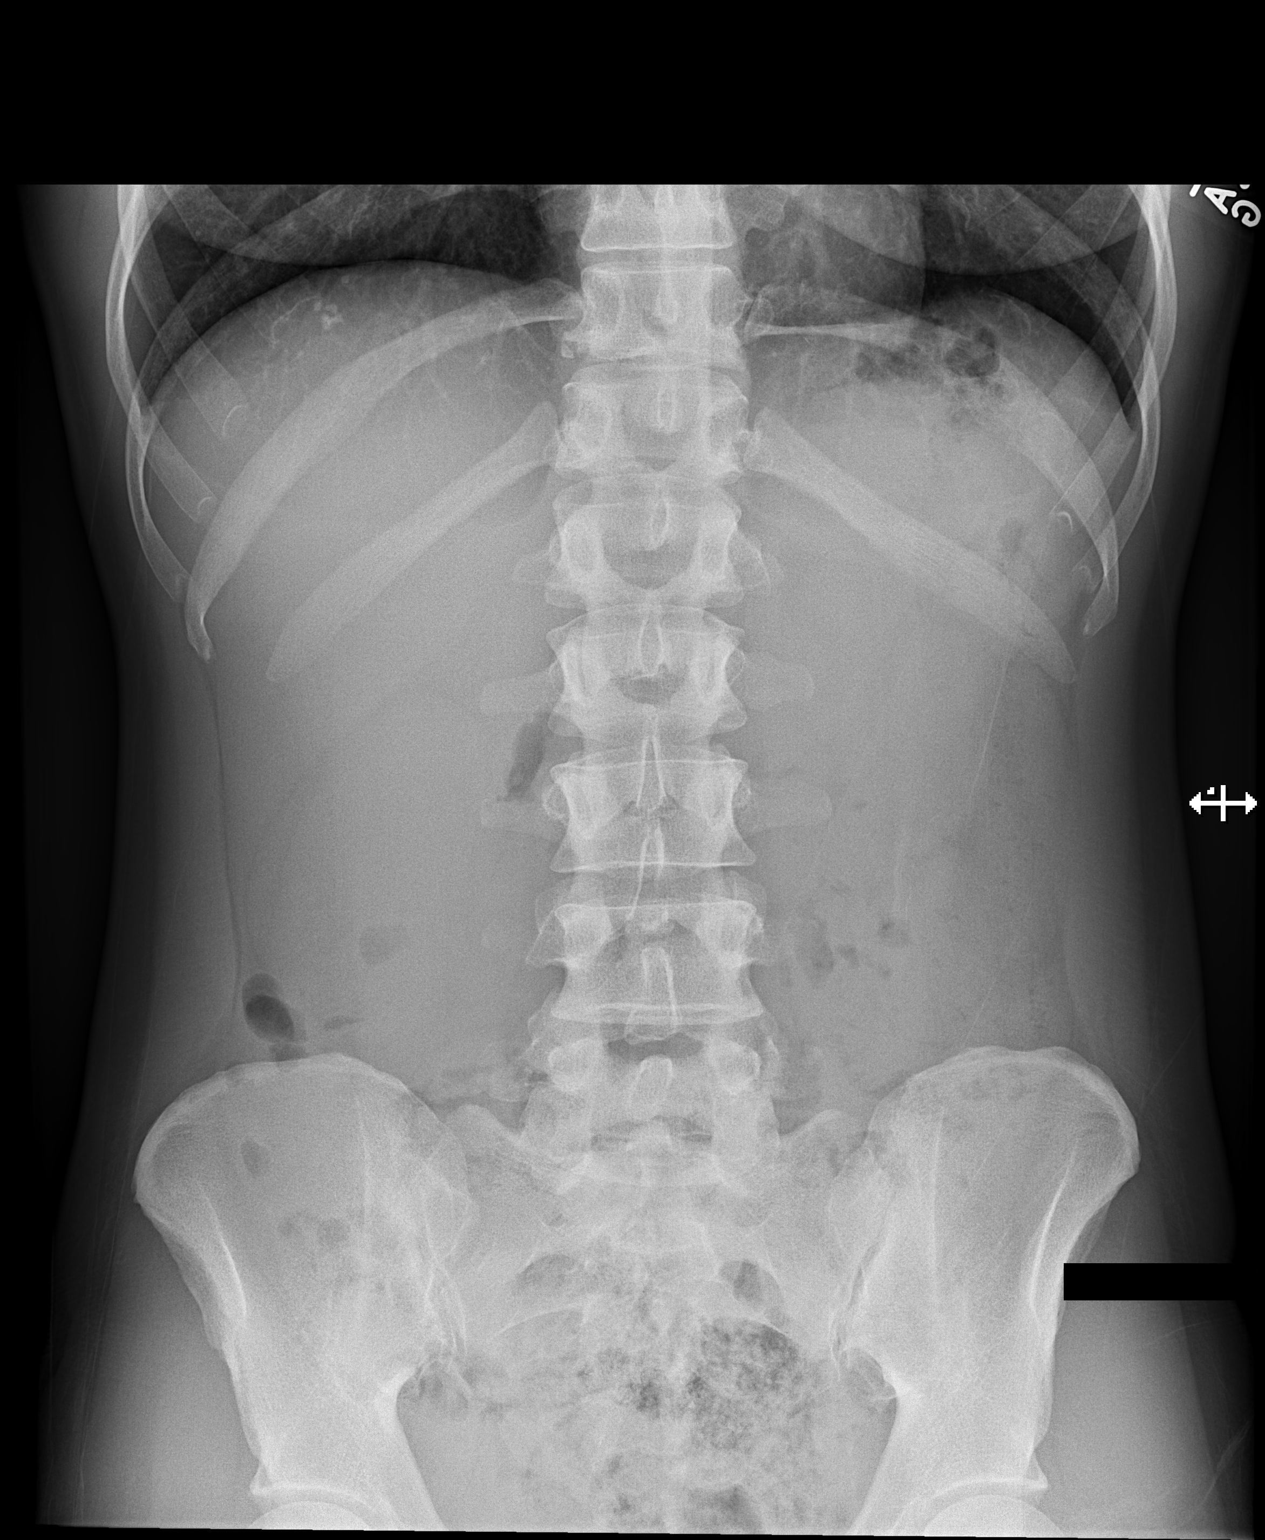

[t abdomen supine]
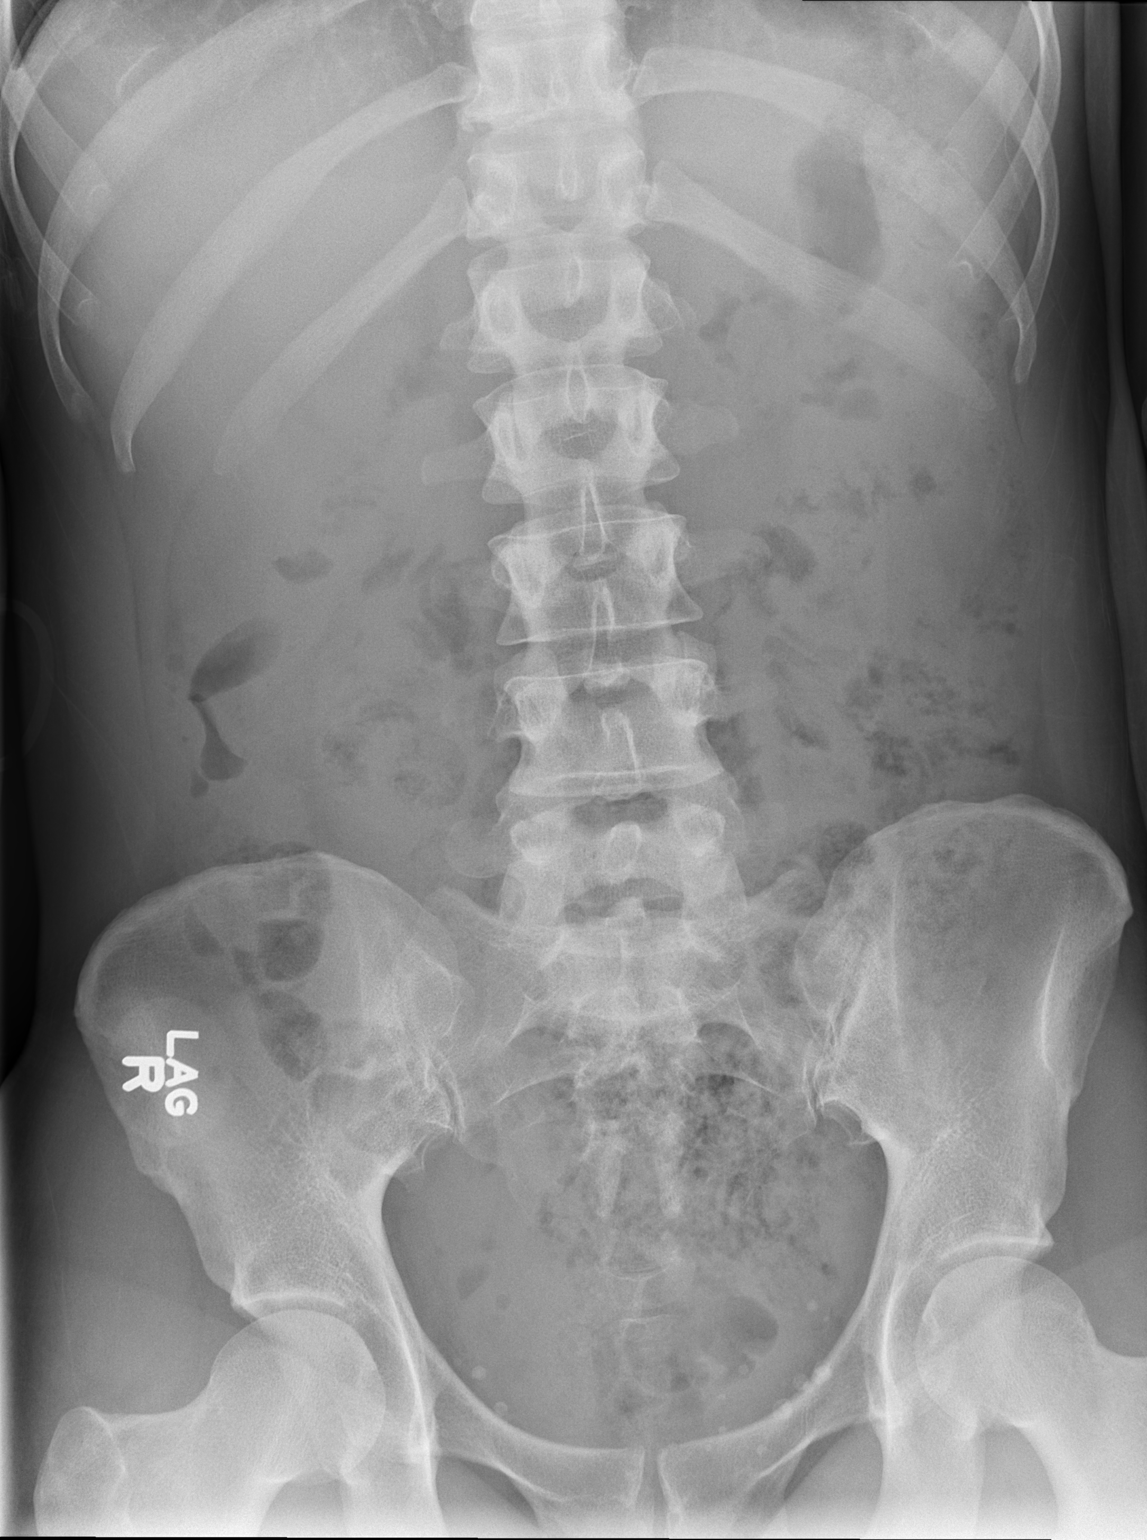

[3 of 3 positions shown; findings below may reference images not displayed]

FINDINGS: The heart size and mediastinal contours are normal. The
lungs are clear. There is no pleural effusion or pneumothorax. No
acute osseous findings are identified.

Colonic stool does not appear significantly increased.  The bowel
gas pattern is normal.  There are bilateral pelvic phleboliths.  No
pneumoperitoneum or suspicious calcification is identified.
Osseous structures appear normal.
IMPRESSION: No active cardiopulmonary or abdominal process identified.

## 2013-09-06 ENCOUNTER — Encounter (HOSPITAL_BASED_OUTPATIENT_CLINIC_OR_DEPARTMENT_OTHER): Payer: Self-pay | Admitting: Emergency Medicine

## 2013-09-06 ENCOUNTER — Emergency Department (HOSPITAL_BASED_OUTPATIENT_CLINIC_OR_DEPARTMENT_OTHER)
Admission: EM | Admit: 2013-09-06 | Discharge: 2013-09-06 | Disposition: A | Discharge status: Home / Self Care | Payer: Non-veteran care | Attending: Emergency Medicine | Admitting: Emergency Medicine

## 2013-09-06 DIAGNOSIS — F17200 Nicotine dependence, unspecified, uncomplicated: Secondary | ICD-10-CM | POA: Insufficient documentation

## 2013-09-06 DIAGNOSIS — Z3202 Encounter for pregnancy test, result negative: Secondary | ICD-10-CM | POA: Diagnosis not present

## 2013-09-06 DIAGNOSIS — Z792 Long term (current) use of antibiotics: Secondary | ICD-10-CM | POA: Insufficient documentation

## 2013-09-06 DIAGNOSIS — IMO0002 Reserved for concepts with insufficient information to code with codable children: Secondary | ICD-10-CM | POA: Diagnosis not present

## 2013-09-06 DIAGNOSIS — R110 Nausea: Secondary | ICD-10-CM | POA: Diagnosis not present

## 2013-09-06 DIAGNOSIS — Z8659 Personal history of other mental and behavioral disorders: Secondary | ICD-10-CM | POA: Diagnosis not present

## 2013-09-06 DIAGNOSIS — R197 Diarrhea, unspecified: Secondary | ICD-10-CM | POA: Diagnosis not present

## 2013-09-06 DIAGNOSIS — Z79899 Other long term (current) drug therapy: Secondary | ICD-10-CM | POA: Diagnosis not present

## 2013-09-06 DIAGNOSIS — R42 Dizziness and giddiness: Secondary | ICD-10-CM | POA: Diagnosis not present

## 2013-09-06 DIAGNOSIS — R109 Unspecified abdominal pain: Secondary | ICD-10-CM | POA: Diagnosis present

## 2013-09-06 LAB — URINALYSIS, ROUTINE W REFLEX MICROSCOPIC
BILIRUBIN URINE: NEGATIVE
Glucose, UA: NEGATIVE mg/dL
Hgb urine dipstick: NEGATIVE
Ketones, ur: NEGATIVE mg/dL
LEUKOCYTES UA: NEGATIVE
Nitrite: NEGATIVE
PH: 6 (ref 5.0–8.0)
Protein, ur: NEGATIVE mg/dL
SPECIFIC GRAVITY, URINE: 1.003 — AB (ref 1.005–1.030)
UROBILINOGEN UA: 0.2 mg/dL (ref 0.0–1.0)

## 2013-09-06 LAB — COMPREHENSIVE METABOLIC PANEL
ALK PHOS: 41 U/L (ref 39–117)
ALT: 17 U/L (ref 0–35)
AST: 20 U/L (ref 0–37)
Albumin: 4.2 g/dL (ref 3.5–5.2)
BUN: 5 mg/dL — ABNORMAL LOW (ref 6–23)
CALCIUM: 10.2 mg/dL (ref 8.4–10.5)
CO2: 25 meq/L (ref 19–32)
Chloride: 100 mEq/L (ref 96–112)
Creatinine, Ser: 0.6 mg/dL (ref 0.50–1.10)
GFR calc non Af Amer: >90 mL/min (ref >90)
GLUCOSE: 97 mg/dL (ref 70–99)
POTASSIUM: 3.8 meq/L (ref 3.7–5.3)
SODIUM: 139 meq/L (ref 137–147)
Total Bilirubin: 0.7 mg/dL (ref 0.3–1.2)
Total Protein: 7.2 g/dL (ref 6.0–8.3)

## 2013-09-06 LAB — CBC WITH DIFFERENTIAL/PLATELET
BASOS ABS: 0.1 10*3/uL (ref 0.0–0.1)
Basophils Relative: 1 % (ref 0–1)
Eosinophils Absolute: 0.1 10*3/uL (ref 0.0–0.7)
Eosinophils Relative: 1 % (ref 0–5)
HCT: 37 % (ref 36.0–46.0)
Hemoglobin: 12.4 g/dL (ref 12.0–15.0)
LYMPHS PCT: 39 % (ref 12–46)
Lymphs Abs: 1.5 10*3/uL (ref 0.7–4.0)
MCH: 28.6 pg (ref 26.0–34.0)
MCHC: 33.5 g/dL (ref 30.0–36.0)
MCV: 85.3 fL (ref 78.0–100.0)
Monocytes Absolute: 0.4 10*3/uL (ref 0.1–1.0)
Monocytes Relative: 11 % (ref 3–12)
Neutro Abs: 1.9 10*3/uL (ref 1.7–7.7)
Neutrophils Relative %: 48 % (ref 43–77)
PLATELETS: 237 10*3/uL (ref 150–400)
RBC: 4.34 MIL/uL (ref 3.87–5.11)
RDW: 12.9 % (ref 11.5–15.5)
WBC: 3.9 10*3/uL — AB (ref 4.0–10.5)

## 2013-09-06 LAB — LIPASE, BLOOD: Lipase: 25 U/L (ref 11–59)

## 2013-09-06 LAB — PREGNANCY, URINE: Preg Test, Ur: NEGATIVE

## 2013-09-06 MED ORDER — ONDANSETRON HCL 4 MG PO TABS: 4.0000 mg | ORAL_TABLET | Freq: Four times a day (QID) | ORAL | Status: AC

## 2013-09-06 MED ORDER — ONDANSETRON 4 MG PO TBDP
4.0000 mg | ORAL_TABLET | Freq: Once | ORAL | Status: AC
  Administered 2013-09-06: 4 mg via ORAL
  Filled 2013-09-06: qty 1

## 2013-09-06 MED ORDER — LOPERAMIDE HCL 2 MG PO CAPS: 2.0000 mg | ORAL_CAPSULE | Freq: Four times a day (QID) | ORAL | Status: AC | PRN

## 2013-09-06 MED ORDER — DICYCLOMINE HCL 20 MG PO TABS: 20.0000 mg | ORAL_TABLET | Freq: Two times a day (BID) | ORAL | Status: DC

## 2013-09-06 NOTE — ED Provider Notes (Signed)
TIME SEEN: 12:05 PM  CHIEF COMPLAINT: abdominal cramping, nausea, diarrhea  HPI: Pt is a 38 y.o. F with h/o anxiety and depression who presents to the ED week of nausea, 3 days of feeling lightheaded and tired, diarrhea and abdominal cramping that started yesterday. She states that she has not had any fevers, chills, vomiting, dysuria or hematuria, vaginal bleeding or discharge. Her last period was last week. No prior history of abdominal surgery. No sick contacts, recent travel, and about use or hospitalization. She states she is here because she wants a stool culture performed.  ROS: See HPI Constitutional: no fever  Eyes: no drainage  ENT: no runny nose   Cardiovascular:  no chest pain  Resp: no SOB  GI: no vomiting GU: no dysuria Integumentary: no rash  Allergy: no hives  Musculoskeletal: no leg swelling  Neurological: no slurred speech ROS otherwise negative  PAST MEDICAL HISTORY/PAST SURGICAL HISTORY:  Past Medical History  Diagnosis Date  . Anxiety   . Depression     MEDICATIONS:  Prior to Admission medications   Medication Sig Start Date End Date Taking? Authorizing Provider  amoxicillin-clavulanate (AUGMENTIN) 875-125 MG per tablet Take 1 tablet by mouth 2 (two) times daily. 06/27/13   Ozella Rocksavid J Merrell, MD  ipratropium (ATROVENT) 0.06 % nasal spray Place 2 sprays into both nostrils 4 (four) times daily. 06/25/13   Rodolph BongEvan S Corey, MD  predniSONE (DELTASONE) 10 MG tablet Take 3 tablets (30 mg total) by mouth daily. 06/25/13   Rodolph BongEvan S Corey, MD    ALLERGIES:  No Known Allergies  SOCIAL HISTORY:  History  Substance Use Topics  . Smoking status: Current Every Day Smoker -- 0.50 packs/day    Types: Cigarettes  . Smokeless tobacco: Not on file  . Alcohol Use: No    FAMILY HISTORY: No family history on file.  EXAM: BP 122/85  Pulse 89  Temp(Src) 97.5 F (36.4 C) (Oral)  Resp 16  Ht 5\' 4"  (1.626 m)  Wt 120 lb (54.432 kg)  BMI 20.59 kg/m2  SpO2 100%  LMP  08/25/2013 CONSTITUTIONAL: Alert and oriented and responds appropriately to questions. Well-appearing; well-nourished HEAD: Normocephalic EYES: Conjunctivae clear, PERRL ENT: normal nose; no rhinorrhea; moist mucous membranes; pharynx without lesions noted NECK: Supple, no meningismus, no LAD  CARD: RRR; S1 and S2 appreciated; no murmurs, no clicks, no rubs, no gallops RESP: Normal chest excursion without splinting or tachypnea; breath sounds clear and equal bilaterally; no wheezes, no rhonchi, no rales,  ABD/GI: Normal bowel sounds; non-distended; soft, non-tender, no rebound, no guarding BACK:  The back appears normal and is non-tender to palpation, there is no CVA tenderness EXT: Normal ROM in all joints; non-tender to palpation; no edema; normal capillary refill; no cyanosis    SKIN: Normal color for age and race; warm NEURO: Moves all extremities equally PSYCH: The patient's mood and manner are appropriate. Grooming and personal hygiene are appropriate.  MEDICAL DECISION MAKING: Patient here with likely viral symptoms. Have discussed with patient I do not feel she needs a stool culture today although she is very insistent and very upset. Patient reports that she has only had diarrhea for 24 hours but states that she has had diarrhea every time she eats and every time she goes to the bathroom. Have discussed with patient that I will not start her on empiric antibiotics and I do not feel she needs abdominal imaging and she states she is okay with that plan. Her urine shows no sign of  infection and her urine pregnancy is negative. We'll check basic labs and give Zofran and reassess. I am not concerned for any life-threatening illness at this time as her abdominal exam is very benign and she is well-appearing, nontoxic with normal vital signs. Differential diagnosis includes gastroenteritis, colitis, less likely appendicitis and she has no tenderness at McBurney's point, pancreatitis.  ED PROGRESS:  Patient's labs are unremarkable. She has no leukocytosis. Her creatinine, LFTs and lipase are normal. She is in the room eating celery. She has not had any vomiting. She now states that she feels that she is able to have a bowel movement and is requesting that we collect a sample for stool culture. We'll obtain stool culture but discussed with patient that she will have to followup with her primary care physician. I do not feel that she needs any treatment at this time other than prn antiemetics. Have discussed return precautions and supportive care instructions. Patient verbalizes understanding and is comfortable with plan.     Layla MawKristen N Verdell Dykman, DO 09/06/13 1310

## 2013-09-06 NOTE — ED Notes (Signed)
Pt tolerated apple juice-one episode of diarrhea-stool sample obtained

## 2013-09-06 NOTE — Discharge Instructions (Signed)
Diarrhea Diarrhea is frequent loose and watery bowel movements. It can cause you to feel weak and dehydrated. Dehydration can cause you to become tired and thirsty, have a dry mouth, and have decreased urination that often is dark yellow. Diarrhea is a sign of another problem, most often an infection that will not last long. In most cases, diarrhea typically lasts 2 3 days. However, it can last longer if it is a sign of something more serious. It is important to treat your diarrhea as directed by your caregive to lessen or prevent future episodes of diarrhea. CAUSES  Some common causes include:  Gastrointestinal infections caused by viruses, bacteria, or parasites.  Food poisoning or food allergies.  Certain medicines, such as antibiotics, chemotherapy, and laxatives.  Artificial sweeteners and fructose.  Digestive disorders. HOME CARE INSTRUCTIONS  Ensure adequate fluid intake (hydration): have 1 cup (8 oz) of fluid for each diarrhea episode. Avoid fluids that contain simple sugars or sports drinks, fruit juices, whole milk products, and sodas. Your urine should be clear or pale yellow if you are drinking enough fluids. Hydrate with an oral rehydration solution that you can purchase at pharmacies, retail stores, and online. You can prepare an oral rehydration solution at home by mixing the following ingredients together:    tsp table salt.   tsp baking soda.   tsp salt substitute containing potassium chloride.  1  tablespoons sugar.  1 L (34 oz) of water.  Certain foods and beverages may increase the speed at which food moves through the gastrointestinal (GI) tract. These foods and beverages should be avoided and include:  Caffeinated and alcoholic beverages.  High-fiber foods, such as raw fruits and vegetables, nuts, seeds, and whole grain breads and cereals.  Foods and beverages sweetened with sugar alcohols, such as xylitol, sorbitol, and mannitol.  Some foods may be well  tolerated and may help thicken stool including:  Starchy foods, such as rice, toast, pasta, low-sugar cereal, oatmeal, grits, baked potatoes, crackers, and bagels.  Bananas.  Applesauce.  Add probiotic-rich foods to help increase healthy bacteria in the GI tract, such as yogurt and fermented milk products.  Wash your hands well after each diarrhea episode.  Only take over-the-counter or prescription medicines as directed by your caregiver.  Take a warm bath to relieve any burning or pain from frequent diarrhea episodes. SEEK IMMEDIATE MEDICAL CARE IF:   You are unable to keep fluids down.  You have persistent vomiting.  You have blood in your stool, or your stools are black and tarry.  You do not urinate in 6 8 hours, or there is only a small amount of very dark urine.  You have abdominal pain that increases or localizes.  You have weakness, dizziness, confusion, or lightheadedness.  You have a severe headache.  Your diarrhea gets worse or does not get better.  You have a fever or persistent symptoms for more than 2 3 days.  You have a fever and your symptoms suddenly get worse. MAKE SURE YOU:   Understand these instructions.  Will watch your condition.  Will get help right away if you are not doing well or get worse. Document Released: 04/16/2002 Document Revised: 04/12/2012 Document Reviewed: 01/02/2012 [employer] Patient Information 2014 [employer]. Diet for Diarrhea, Adult Frequent, runny stools (diarrhea) may be caused or worsened by food or drink. Diarrhea may be relieved by changing your diet. Since diarrhea can last up to 7 days, it is easy for you to lose too much fluid   from the body and become dehydrated. Fluids that are lost need to be replaced. Along with a modified diet, make sure you drink enough fluids to keep your urine clear or pale yellow. DIET INSTRUCTIONS  Ensure adequate fluid intake (hydration): have 1 cup (8 oz) of fluid for each diarrhea  episode. Avoid fluids that contain simple sugars or sports drinks, fruit juices, whole milk products, and sodas. Your urine should be clear or pale yellow if you are drinking enough fluids. Hydrate with an oral rehydration solution that you can purchase at pharmacies, retail stores, and online. You can prepare an oral rehydration solution at home by mixing the following ingredients together:    tsp table salt.   tsp baking soda.   tsp salt substitute containing potassium chloride.  1  tablespoons sugar.  1 L (34 oz) of water.  Certain foods and beverages may increase the speed at which food moves through the gastrointestinal (GI) tract. These foods and beverages should be avoided and include:  Caffeinated and alcoholic beverages.  High-fiber foods, such as raw fruits and vegetables, nuts, seeds, and whole grain breads and cereals.  Foods and beverages sweetened with sugar alcohols, such as xylitol, sorbitol, and mannitol.  Some foods may be well tolerated and may help thicken stool including:  Starchy foods, such as rice, toast, pasta, low-sugar cereal, oatmeal, grits, baked potatoes, crackers, and bagels.   Bananas.   Applesauce.  Add probiotic-rich foods to help increase healthy bacteria in the GI tract, such as yogurt and fermented milk products. RECOMMENDED FOODS AND BEVERAGES Starches Choose foods with less than 2 g of fiber per serving.  Recommended:  White, French, and pita breads, plain rolls, buns, bagels. Plain muffins, matzo. Soda, saltine, or graham crackers. Pretzels, melba toast, zwieback. Cooked cereals made with water: cornmeal, farina, cream cereals. Dry cereals: refined corn, wheat, rice. Potatoes prepared any way without skins, refined macaroni, spaghetti, noodles, refined rice.  Avoid:  Bread, rolls, or crackers made with whole wheat, multi-grains, rye, bran seeds, nuts, or coconut. Corn tortillas or taco shells. Cereals containing whole grains, multi-grains,  bran, coconut, nuts, raisins. Cooked or dry oatmeal. Coarse wheat cereals, granola. Cereals advertised as "high-fiber." Potato skins. Whole grain pasta, wild or brown rice. Popcorn. Sweet potatoes, yams. Sweet rolls, doughnuts, waffles, pancakes, sweet breads. Vegetables  Recommended: Strained tomato and vegetable juices. Most well-cooked and canned vegetables without seeds. Fresh: Tender lettuce, cucumber without the skin, cabbage, spinach, bean sprouts.  Avoid: Fresh, cooked, or canned: Artichokes, baked beans, beet greens, broccoli, Brussels sprouts, corn, kale, legumes, peas, sweet potatoes. Cooked: Green or red cabbage, spinach. Avoid large servings of any vegetables because vegetables shrink when cooked, and they contain more fiber per serving than fresh vegetables. Fruit  Recommended: Cooked or canned: Apricots, applesauce, cantaloupe, cherries, fruit cocktail, grapefruit, grapes, kiwi, mandarin oranges, peaches, pears, plums, watermelon. Fresh: Apples without skin, ripe banana, grapes, cantaloupe, cherries, grapefruit, peaches, oranges, plums. Keep servings limited to  cup or 1 piece.  Avoid: Fresh: Apples with skin, apricots, mangoes, pears, raspberries, strawberries. Prune juice, stewed or dried prunes. Dried fruits, raisins, dates. Large servings of all fresh fruits. Protein  Recommended: Ground or well-cooked tender beef, ham, veal, lamb, pork, or poultry. Eggs. Fish, oysters, shrimp, lobster, other seafoods. Liver, organ meats.  Avoid: Tough, fibrous meats with gristle. Peanut butter, smooth or chunky. Cheese, nuts, seeds, legumes, dried peas, beans, lentils. Dairy  Recommended: Yogurt, lactose-free milk, kefir, drinkable yogurt, buttermilk, soy milk, or plain hard cheese.    Avoid: Milk, chocolate milk, beverages made with milk, such as milkshakes. Soups  Recommended: Bouillon, broth, or soups made from allowed foods. Any strained soup.  Avoid: Soups made from vegetables that  are not allowed, cream or milk-based soups. Desserts and Sweets  Recommended: Sugar-free gelatin, sugar-free frozen ice pops made without sugar alcohol.  Avoid: Plain cakes and cookies, pie made with fruit, pudding, custard, cream pie. Gelatin, fruit, ice, sherbet, frozen ice pops. Ice cream, ice milk without nuts. Plain hard candy, honey, jelly, molasses, syrup, sugar, chocolate syrup, gumdrops, marshmallows. Fats and Oils  Recommended: Limit fats to less than 8 tsp per day.  Avoid: Seeds, nuts, olives, avocados. Margarine, butter, cream, mayonnaise, salad oils, plain salad dressings. Plain gravy, crisp bacon without rind. Beverages  Recommended: Water, decaffeinated teas, oral rehydration solutions, sugar-free beverages not sweetened with sugar alcohols.  Avoid: Fruit juices, caffeinated beverages (coffee, tea, soda), alcohol, sports drinks, or lemon-lime soda. Condiments  Recommended: Ketchup, mustard, horseradish, vinegar, cocoa powder. Spices in moderation: allspice, basil, bay leaves, celery powder or leaves, cinnamon, cumin powder, curry powder, ginger, mace, marjoram, onion or garlic powder, oregano, paprika, parsley flakes, ground pepper, rosemary, sage, savory, tarragon, thyme, turmeric.  Avoid: Coconut, honey. Document Released: 07/17/2003 Document Revised: 01/19/2012 Document Reviewed: 09/10/2011 [employer] Patient Information 2014 [employer].  

## 2013-09-06 NOTE — ED Notes (Signed)
C/o nausea, fatigue, diarrhea, abs cramps

## 2013-09-11 LAB — STOOL CULTURE

## 2014-09-07 IMAGING — CT CT HEAD W/O CM
1 series · 16 of 30 positions shown, 20 images · non-contrast
Comparison: 11/08/2006

CLINICAL DATA: Right-sided facial numbness

CT HEAD WITHOUT CONTRAST
TECHNIQUE: Contiguous axial images were obtained from the base of
the skull through the vertex without contrast.

[Series 2: head 4.8 h37s · axial · 0.44mm/px · z∈[-145,-12]mm · 16 of 32 slices shown, 20 images]
[im 2/32  brain]
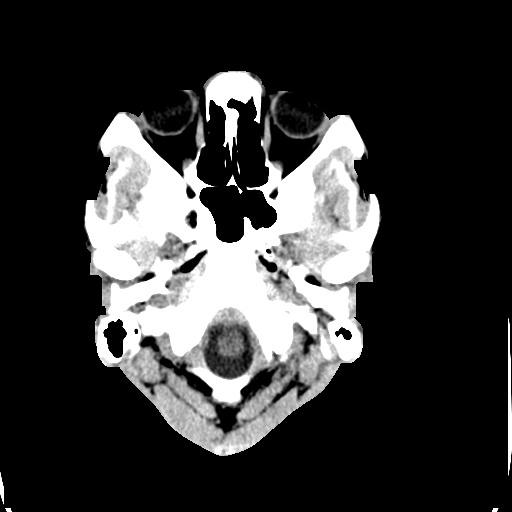
[im 2/32  bone]
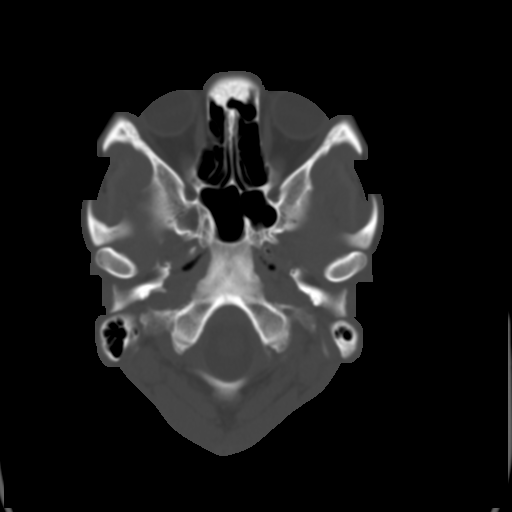
[im 4/32  brain]
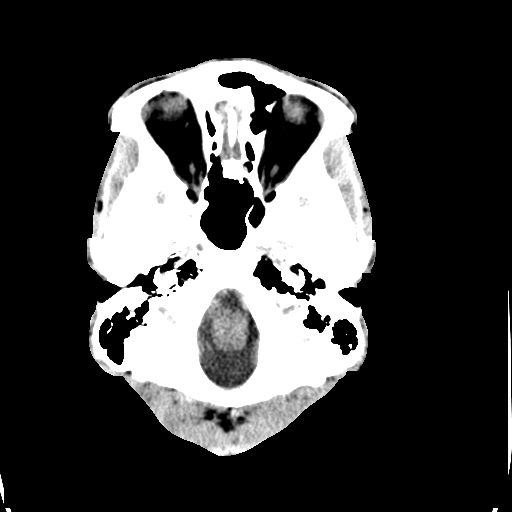
[im 6/32  brain]
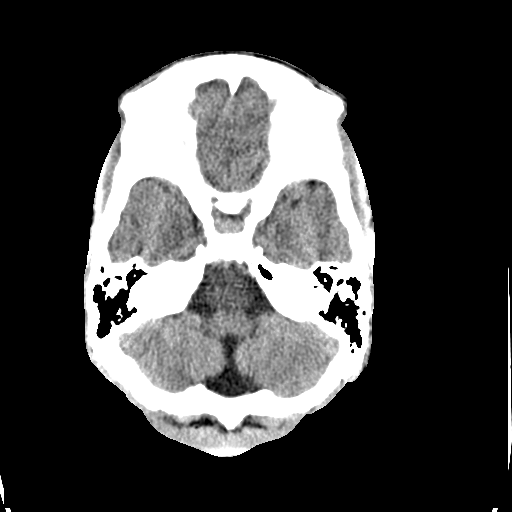
[im 8/32  brain]
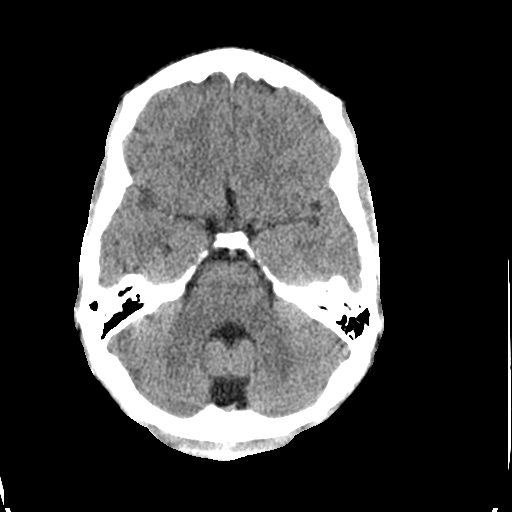
[im 9/32  brain]
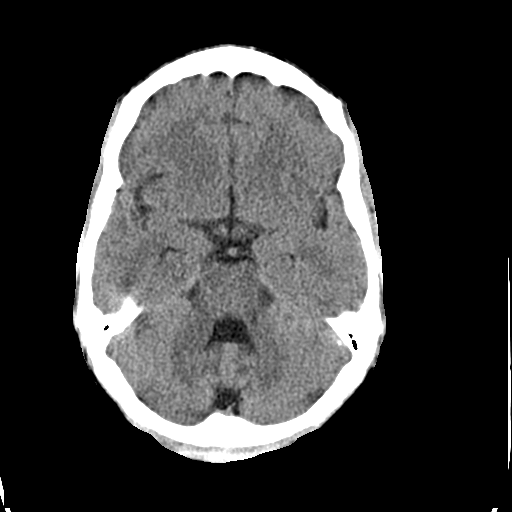
[im 9/32  bone]
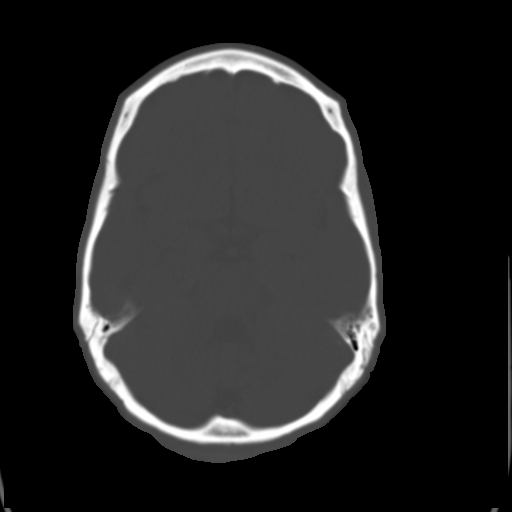
[im 11/32  brain]
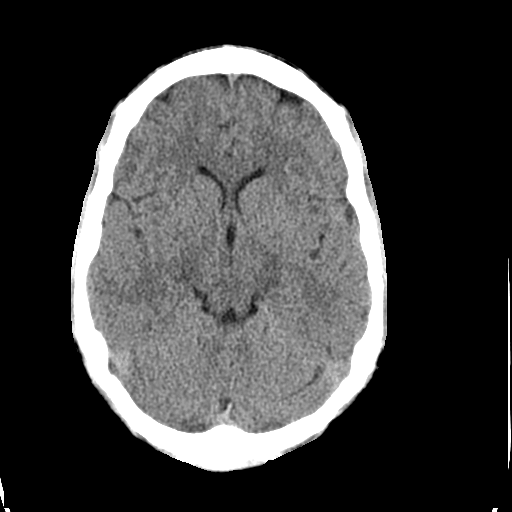
[im 13/32  brain]
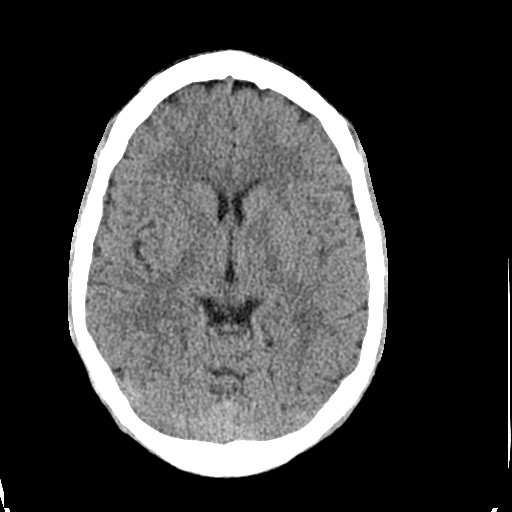
[im 15/32  brain]
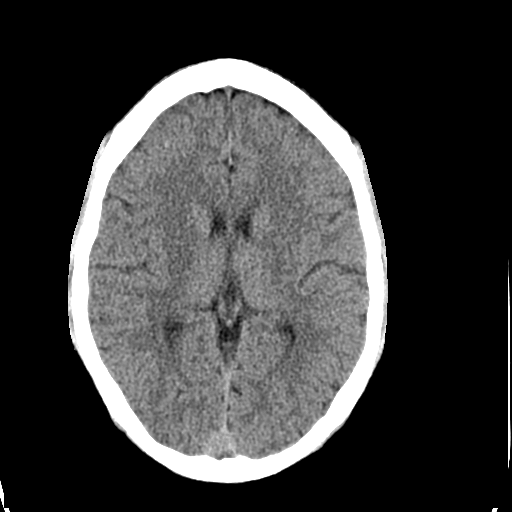
[im 17/32  brain]
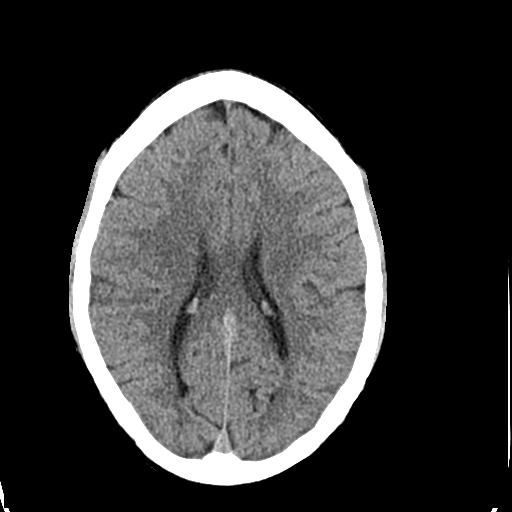
[im 17/32  bone]
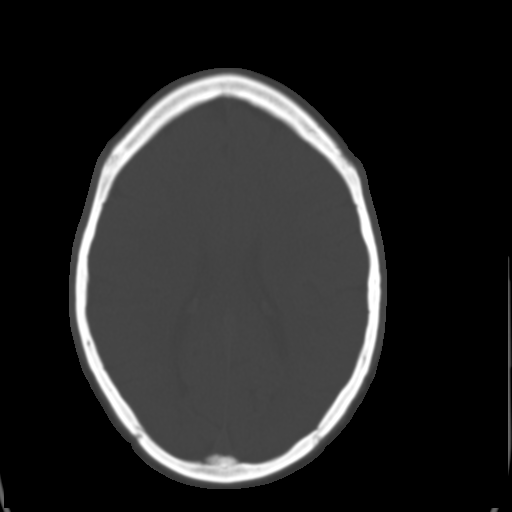
[im 19/32  brain]
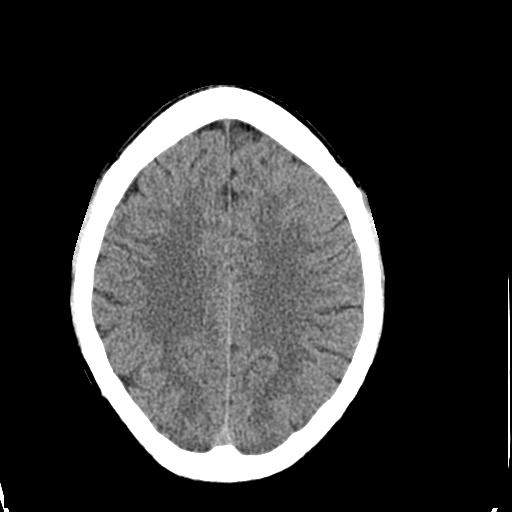
[im 21/32  brain]
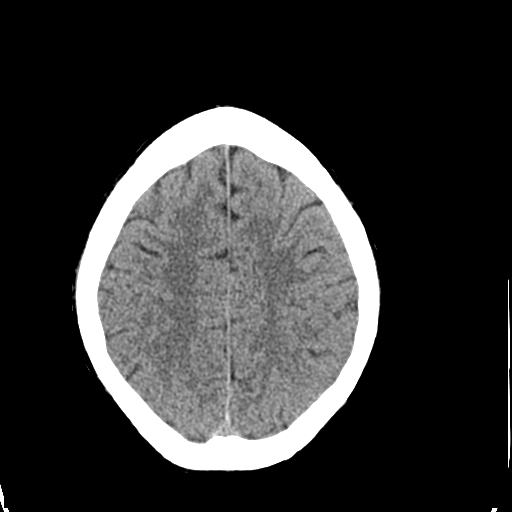
[im 23/32  brain]
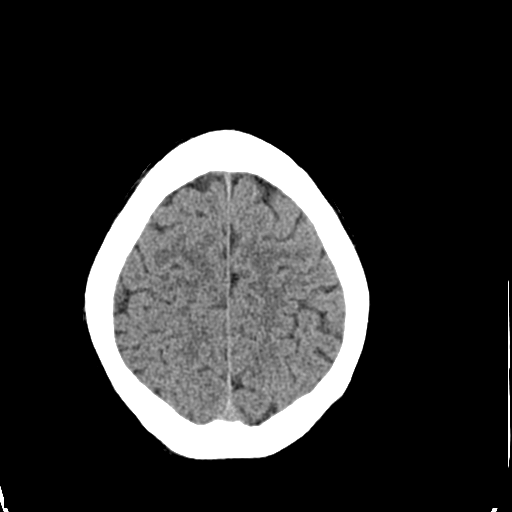
[im 24/32  brain]
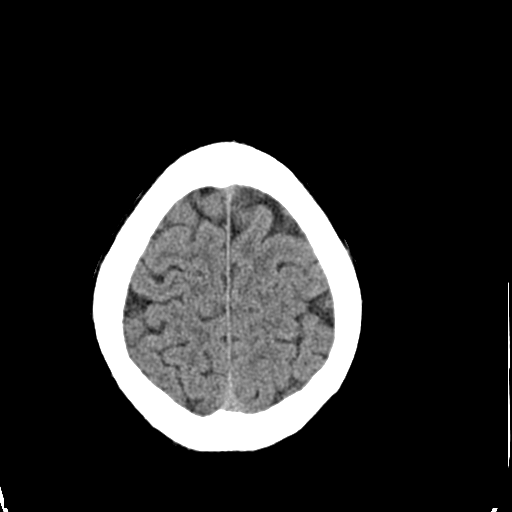
[im 24/32  bone]
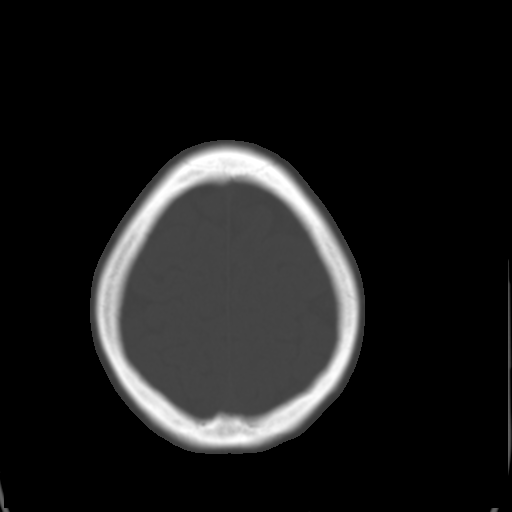
[im 26/32  brain]
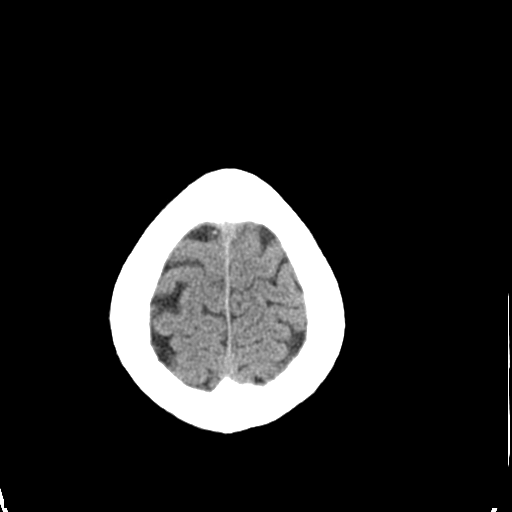
[im 28/32  brain]
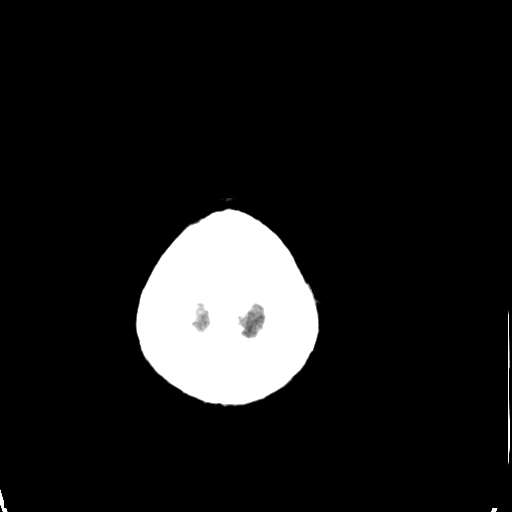
[im 30/32  brain]
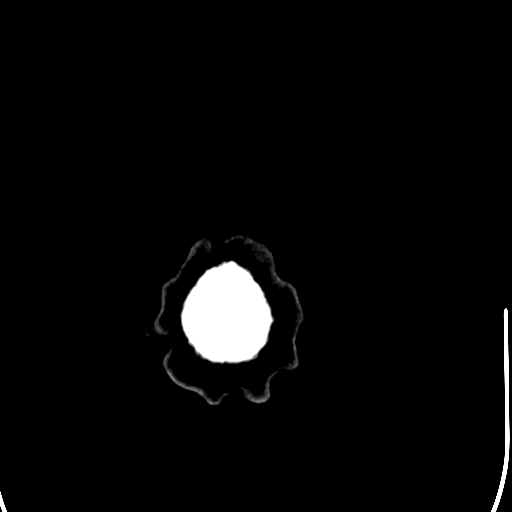

[16 of 30 positions shown; findings below may reference images not displayed]

FINDINGS: No acute hemorrhage, acute infarction, or mass lesion is
identified.  No midline shift.  No ventriculomegaly.  No skull
fracture.  Orbits and paranasal sinuses are intact.  No soft tissue
abnormality.
IMPRESSION: No acute intracranial finding.  No change.

## 2018-10-04 ENCOUNTER — Other Ambulatory Visit: Payer: Self-pay | Admitting: Obstetrics & Gynecology

## 2020-01-30 ENCOUNTER — Encounter: Payer: Self-pay | Admitting: Gastroenterology

## 2020-02-06 ENCOUNTER — Telehealth: Payer: Self-pay | Admitting: *Deleted

## 2020-02-06 NOTE — Telephone Encounter (Signed)
Per referral info, pt needs a colonoscopy for IDA- she is not a screening colonoscopy.  She will need an OV first.  LMOM to call back to discuss this

## 2020-02-07 NOTE — Telephone Encounter (Signed)
LMOM to have pt call back regarding appt.

## 2020-02-08 NOTE — Telephone Encounter (Signed)
LMOM to call back regarding appt 

## 2020-02-11 NOTE — Telephone Encounter (Signed)
OV  12-3 230 pm  [place]

## 2020-02-11 NOTE — Telephone Encounter (Signed)
LM to return call to schedule OV

## 2020-02-13 ENCOUNTER — Encounter: Payer: Self-pay | Admitting: Gastroenterology

## 2020-02-13 ENCOUNTER — Encounter

## 2020-03-05 ENCOUNTER — Encounter: Admitting: Gastroenterology

## 2020-04-11 ENCOUNTER — Ambulatory Visit: Admitting: Gastroenterology

## 2020-06-02 DIAGNOSIS — J329 Chronic sinusitis, unspecified: Secondary | ICD-10-CM | POA: Insufficient documentation

## 2020-06-02 DIAGNOSIS — H6993 Unspecified Eustachian tube disorder, bilateral: Secondary | ICD-10-CM | POA: Insufficient documentation

## 2022-02-08 ENCOUNTER — Telehealth: Payer: Self-pay | Admitting: Internal Medicine

## 2022-02-08 NOTE — Telephone Encounter (Signed)
Scheduled appointment per 10/02. Patient is aware of appointment date and time. Patient is aware to arrive 15 mins prior to appointment time and to bring updated insurance cards. Patient is aware of location.   

## 2022-02-24 ENCOUNTER — Inpatient Hospital Stay: Payer: No Typology Code available for payment source

## 2022-02-24 ENCOUNTER — Other Ambulatory Visit: Payer: Self-pay

## 2022-02-24 ENCOUNTER — Other Ambulatory Visit: Payer: Self-pay | Admitting: Internal Medicine

## 2022-02-24 ENCOUNTER — Inpatient Hospital Stay: Payer: No Typology Code available for payment source | Attending: Internal Medicine | Admitting: Internal Medicine

## 2022-02-24 VITALS — BP 113/91 | HR 86 | Temp 97.7°F | Resp 18 | Ht 64.0 in | Wt 125.1 lb

## 2022-02-24 DIAGNOSIS — K5900 Constipation, unspecified: Secondary | ICD-10-CM | POA: Diagnosis not present

## 2022-02-24 DIAGNOSIS — R5383 Other fatigue: Secondary | ICD-10-CM | POA: Insufficient documentation

## 2022-02-24 DIAGNOSIS — D539 Nutritional anemia, unspecified: Secondary | ICD-10-CM

## 2022-02-24 DIAGNOSIS — F17210 Nicotine dependence, cigarettes, uncomplicated: Secondary | ICD-10-CM | POA: Diagnosis not present

## 2022-02-24 DIAGNOSIS — K922 Gastrointestinal hemorrhage, unspecified: Secondary | ICD-10-CM | POA: Diagnosis not present

## 2022-02-24 DIAGNOSIS — D500 Iron deficiency anemia secondary to blood loss (chronic): Secondary | ICD-10-CM | POA: Insufficient documentation

## 2022-02-24 LAB — CBC WITH DIFFERENTIAL (CANCER CENTER ONLY)
Abs Immature Granulocytes: 0.01 10*3/uL (ref 0.00–0.07)
Basophils Absolute: 0.1 10*3/uL (ref 0.0–0.1)
Basophils Relative: 2 %
Eosinophils Absolute: 0.1 10*3/uL (ref 0.0–0.5)
Eosinophils Relative: 3 %
HCT: 33.1 % — ABNORMAL LOW (ref 36.0–46.0)
Hemoglobin: 10.7 g/dL — ABNORMAL LOW (ref 12.0–15.0)
Immature Granulocytes: 0 %
Lymphocytes Relative: 34 %
Lymphs Abs: 0.9 10*3/uL (ref 0.7–4.0)
MCH: 24.7 pg — ABNORMAL LOW (ref 26.0–34.0)
MCHC: 32.3 g/dL (ref 30.0–36.0)
MCV: 76.4 fL — ABNORMAL LOW (ref 80.0–100.0)
Monocytes Absolute: 0.2 10*3/uL (ref 0.1–1.0)
Monocytes Relative: 9 %
Neutro Abs: 1.3 10*3/uL — ABNORMAL LOW (ref 1.7–7.7)
Neutrophils Relative %: 52 %
Platelet Count: 395 10*3/uL (ref 150–400)
RBC: 4.33 MIL/uL (ref 3.87–5.11)
RDW: 20.6 % — ABNORMAL HIGH (ref 11.5–15.5)
WBC Count: 2.6 10*3/uL — ABNORMAL LOW (ref 4.0–10.5)
nRBC: 0 % (ref 0.0–0.2)

## 2022-02-24 LAB — IRON AND IRON BINDING CAPACITY (CC-WL,HP ONLY)
Iron: 63 ug/dL (ref 28–170)
Saturation Ratios: 11 % (ref 10.4–31.8)
TIBC: 560 ug/dL — ABNORMAL HIGH (ref 250–450)
UIBC: 497 ug/dL — ABNORMAL HIGH (ref 148–442)

## 2022-02-24 LAB — CMP (CANCER CENTER ONLY)
ALT: 20 U/L (ref 0–44)
AST: 22 U/L (ref 15–41)
Albumin: 4.5 g/dL (ref 3.5–5.0)
Alkaline Phosphatase: 50 U/L (ref 38–126)
Anion gap: 5 (ref 5–15)
BUN: 6 mg/dL (ref 6–20)
CO2: 28 mmol/L (ref 22–32)
Calcium: 9.9 mg/dL (ref 8.9–10.3)
Chloride: 104 mmol/L (ref 98–111)
Creatinine: 0.85 mg/dL (ref 0.44–1.00)
GFR, Estimated: >60 mL/min (ref >60)
Glucose, Bld: 83 mg/dL (ref 70–99)
Potassium: 3.6 mmol/L (ref 3.5–5.1)
Sodium: 137 mmol/L (ref 135–145)
Total Bilirubin: 0.8 mg/dL (ref 0.3–1.2)
Total Protein: 7.2 g/dL (ref 6.5–8.1)

## 2022-02-24 LAB — LACTATE DEHYDROGENASE: LDH: 138 U/L (ref 98–192)

## 2022-02-24 LAB — FERRITIN: Ferritin: 8 ng/mL — ABNORMAL LOW (ref 11–307)

## 2022-02-24 LAB — FOLATE: Folate: 15.1 ng/mL (ref >5.9)

## 2022-02-24 LAB — VITAMIN B12: Vitamin B-12: 531 pg/mL (ref 180–914)

## 2022-02-24 LAB — TSH: TSH: 0.875 u[IU]/mL (ref 0.350–4.500)

## 2022-02-24 NOTE — Progress Notes (Signed)
[place] [phone]   [phone]  CONSULT NOTE  REFERRING PHYSICIAN: Dr. Versie Starks  REASON FOR CONSULTATION:  46 years old African-American female with iron deficiency anemia.  HPI Veronica Davila is a 46 y.o. female with a past medical history significant for anxiety/depression as well as irritable bowel syndrome.  The patient mentions that she has a history of anemia since 2016 that was getting worse over the last several years after she was diagnosed with irritable bowel syndrome.  She was seen in September 2023 by a gastroenterologist Dr. Danie Binder at [facility] complaining of nausea, constipation as well as thin stool, abdominal cramping and hematochezia.  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  She is scheduled for a capsule endoscopy on March 01, 2022. She has been on oral iron tablet but has hard time tolerating it because of her irritable bowel syndrome and upset stomach.  She has not been taking it at regular basis.  She was found to have persistent anemia and she was referred to [place] today for evaluation and recommendation regarding her condition. When seen today she continues to complain of fatigue as well as dizzy spells and shortness of breath with exertion.  She also has intermittent nausea and occasional blood in her stool questionable for hemorrhoids.  The patient has also craving for ice and intermittent chest pain that is mainly midsternal.  She denied having any recent weight loss or night sweats.  She has no headache or visual changes. Family history significant for mother with colon polyps.  Father had hypertension. She is single and has no children.  She works at the [employer].  She has no history for smoking, alcohol or drug abuse.  HPI  Past Medical History:  Diagnosis Date   Anxiety    Depression     Past Surgical History:   Procedure Laterality Date   DILATION AND CURETTAGE OF UTERUS      No family history on file.  Social History Social History   Tobacco Use   Smoking status: Every Day    Packs/day: 0.50    Types: Cigarettes  Substance Use Topics   Alcohol use: No   Drug use: No    No Known Allergies  Current Outpatient Medications  Medication Sig Dispense Refill   amoxicillin-clavulanate (AUGMENTIN) 875-125 MG per tablet Take 1 tablet by mouth 2 (two) times daily. 20 tablet 0   dicyclomine (BENTYL) 20 MG tablet Take 1 tablet (20 mg total) by mouth 2 (two) times daily. As needed for abdominal cramping 20 tablet 0   ipratropium (ATROVENT) 0.06 % nasal spray Place 2 sprays into both nostrils 4 (four) times daily. 15 mL 1   loperamide (IMODIUM) 2 MG capsule Take 1 capsule (2 mg total) by mouth 4 (four) times daily as needed for diarrhea or loose stools. 12 capsule 0   ondansetron (ZOFRAN) 4 MG tablet Take 1 tablet (4 mg total) by mouth every 6 (six) hours. 12 tablet 0   predniSONE (DELTASONE) 10 MG tablet Take 3 tablets (30 mg total) by mouth daily. 15 tablet 0   No current facility-administered medications for this visit.    Review of Systems  Constitutional: positive for fatigue Eyes: negative Ears, nose, mouth, throat, and face: negative Respiratory: positive for dyspnea on exertion Cardiovascular: negative Gastrointestinal: positive for abdominal pain, change in bowel habits, constipation, and nausea Genitourinary:negative Integument/breast: negative Hematologic/lymphatic:  negative Musculoskeletal:negative Neurological: positive for dizziness Behavioral/Psych: negative Endocrine: negative Allergic/Immunologic: negative  Physical Exam  JJO:ACZYS, healthy, no distress, well nourished, and well developed SKIN: skin color, texture, turgor are normal, no rashes or significant lesions HEAD: Normocephalic, No masses, lesions, tenderness or abnormalities EYES: normal, PERRLA,  Conjunctiva are pale and non-injected EARS: External ears normal, Canals clear OROPHARYNX:no exudate, no erythema, and lips, buccal mucosa, and tongue normal  NECK: supple, no adenopathy, no JVD LYMPH:  no palpable lymphadenopathy, no hepatosplenomegaly BREAST:not examined LUNGS: clear to auscultation , and palpation HEART: regular rate & rhythm, no murmurs, and no gallops ABDOMEN:abdomen soft, non-tender, normal bowel sounds, and no masses or organomegaly BACK: Back symmetric, no curvature., No CVA tenderness EXTREMITIES:no joint deformities, effusion, or inflammation, no edema  NEURO: alert & oriented x 3 with fluent speech, no focal motor/sensory deficits  PERFORMANCE STATUS: ECOG 1  LABORATORY DATA: Lab Results  Component Value Date   WBC 2.6 (L) 02/24/2022   HGB 10.7 (L) 02/24/2022   HCT 33.1 (L) 02/24/2022   MCV 76.4 (L) 02/24/2022   PLT 395 02/24/2022      Chemistry      Component Value Date/Time   NA 139 09/06/2013 1220   K 3.8 09/06/2013 1220   CL 100 09/06/2013 1220   CO2 25 09/06/2013 1220   BUN 5 (L) 09/06/2013 1220   CREATININE 0.60 09/06/2013 1220      Component Value Date/Time   CALCIUM 10.2 09/06/2013 1220   ALKPHOS 41 09/06/2013 1220   AST 20 09/06/2013 1220   ALT 17 09/06/2013 1220   BILITOT 0.7 09/06/2013 1220       RADIOGRAPHIC STUDIES: No results found.  ASSESSMENT: This is a very pleasant 46 years old African-American female presented for evaluation of iron deficiency anemia secondary to gastrointestinal blood loss.   PLAN: I had a lengthy discussion with the patient today about her current condition and treatment options. I ordered several studies for evaluation of her anemia including repeat CBC that showed hemoglobin of 10.7 and hematocrit 33.1 with MCV of 76.4.  Her total white blood count was also low at 2.6 and she has normal platelets count.  Iron study showed normal serum iron of 63 but her iron saturation was low normal at 11 with  TIBC of 560.  LDH is normal at 138. I ordered several other studies including serum ferritin, vitamin B12, serum folate, serum protein electrophoresis with immunofixation, TSH, blood for heavy metals. I recommended for the patient to proceed with iron infusion with Venofer 300 Mg IV weekly for 3 weeks starting next week after the capsule endoscopy. I also advised her to continue on the over-the-counter oral ferrous gluconate. I will see her back for follow-up visit in 2 months for evaluation and repeat CBC, iron study and ferritin. The patient was advised to call immediately if she has any other concerning symptoms in the interval.  The patient voices understanding of current disease status and treatment options and is in agreement with the current care plan.  All questions were answered. The patient knows to call the clinic with any problems, questions or concerns. We can certainly see the patient much sooner if necessary.  Thank you so much for allowing me to participate in the care of Veronica Davila. I will continue to follow up the patient with you and assist in her care.  The total time spent in the appointment was 60 minutes.  Disclaimer: This note was dictated with voice recognition software.  Similar sounding words can inadvertently be transcribed and may not be corrected upon review.   Lajuana Matte February 24, 2022, 12:19 PM

## 2022-02-26 LAB — PROTEIN ELECTROPHORESIS, SERUM, WITH REFLEX
A/G Ratio: 1.7 (ref 0.7–1.7)
Albumin ELP: 4.2 g/dL (ref 2.9–4.4)
Alpha-1-Globulin: 0.2 g/dL (ref 0.0–0.4)
Alpha-2-Globulin: 0.6 g/dL (ref 0.4–1.0)
Beta Globulin: 1 g/dL (ref 0.7–1.3)
Gamma Globulin: 0.8 g/dL (ref 0.4–1.8)
Globulin, Total: 2.5 g/dL (ref 2.2–3.9)
Total Protein ELP: 6.7 g/dL (ref 6.0–8.5)

## 2022-02-26 LAB — HEAVY METALS, BLOOD
Arsenic: 2 ug/L (ref 0–9)
Lead: 1.8 ug/dL (ref 0.0–3.4)
Mercury: 1.1 ug/L (ref 0.0–14.9)

## 2022-03-03 ENCOUNTER — Telehealth: Payer: Self-pay | Admitting: Pharmacy Technician

## 2022-03-03 NOTE — Telephone Encounter (Addendum)
Auth Submission: APPROVED  community of care approal Payer [facility]  Medication & CPT/J Code(s) submitted: Venofer (Iron Sucrose) J1756 Route of submission (phone, fax, portal): EMAIL: [email or web address] Phone # Fax # Auth type: Buy/Bill Units/visits requested: 3 DOSES Reference number: [id number] Approval from: 03/25/22 to 02/24/23  at [place]

## 2022-03-23 NOTE — Telephone Encounter (Signed)
Left 2nd v/m with [facility]. Awaiting community of care approval.  [facility] [place]) [phone]

## 2022-03-25 ENCOUNTER — Encounter: Payer: Self-pay | Admitting: Internal Medicine

## 2022-03-25 ENCOUNTER — Other Ambulatory Visit: Payer: Self-pay | Admitting: Pharmacy Technician

## 2022-03-25 DIAGNOSIS — D500 Iron deficiency anemia secondary to blood loss (chronic): Secondary | ICD-10-CM

## 2022-03-31 ENCOUNTER — Ambulatory Visit (INDEPENDENT_AMBULATORY_CARE_PROVIDER_SITE_OTHER): Payer: No Typology Code available for payment source

## 2022-03-31 VITALS — BP 107/62 | HR 83 | Temp 99.1°F | Resp 18 | Ht 64.0 in | Wt 125.6 lb

## 2022-03-31 DIAGNOSIS — D539 Nutritional anemia, unspecified: Secondary | ICD-10-CM

## 2022-03-31 DIAGNOSIS — K922 Gastrointestinal hemorrhage, unspecified: Secondary | ICD-10-CM

## 2022-03-31 DIAGNOSIS — D500 Iron deficiency anemia secondary to blood loss (chronic): Secondary | ICD-10-CM

## 2022-03-31 MED ORDER — ACETAMINOPHEN 325 MG PO TABS
650.0000 mg | ORAL_TABLET | Freq: Once | ORAL | Status: AC
  Administered 2022-03-31: 650 mg via ORAL
  Filled 2022-03-31: qty 2

## 2022-03-31 MED ORDER — DIPHENHYDRAMINE HCL 25 MG PO CAPS
25.0000 mg | ORAL_CAPSULE | Freq: Once | ORAL | Status: AC
  Administered 2022-03-31: 25 mg via ORAL
  Filled 2022-03-31: qty 1

## 2022-03-31 MED ORDER — SODIUM CHLORIDE 0.9 % IV SOLN
300.0000 mg | INTRAVENOUS | Status: DC
  Administered 2022-03-31: 300 mg via INTRAVENOUS
  Filled 2022-03-31: qty 15

## 2022-03-31 NOTE — Progress Notes (Signed)
Diagnosis: Iron Deficiency Anemia  Provider:  Chilton Greathouse MD  Procedure: Infusion  IV Type: Peripheral, IV Location: R Antecubital  Venofer (Iron Sucrose), Dose: 300 mg  Infusion Start Time: 1349  Infusion Stop Time: 1530  Post Infusion IV Care: Observation period completed and Peripheral IV Discontinued  Discharge: Condition: Good, Destination: Home . AVS provided to patient.   Performed by:  Loney Hering, LPN

## 2022-04-07 ENCOUNTER — Ambulatory Visit (INDEPENDENT_AMBULATORY_CARE_PROVIDER_SITE_OTHER): Payer: No Typology Code available for payment source

## 2022-04-07 VITALS — BP 121/73 | HR 80 | Temp 98.8°F | Resp 16 | Ht 64.0 in | Wt 127.0 lb

## 2022-04-07 DIAGNOSIS — D500 Iron deficiency anemia secondary to blood loss (chronic): Secondary | ICD-10-CM

## 2022-04-07 DIAGNOSIS — D539 Nutritional anemia, unspecified: Secondary | ICD-10-CM | POA: Diagnosis not present

## 2022-04-07 MED ORDER — SODIUM CHLORIDE 0.9 % IV SOLN
300.0000 mg | INTRAVENOUS | Status: DC
  Administered 2022-04-07: 300 mg via INTRAVENOUS
  Filled 2022-04-07: qty 15

## 2022-04-07 NOTE — Progress Notes (Signed)
Diagnosis: Iron Deficiency Anemia  Provider:  Chilton Greathouse MD  Procedure: Infusion  IV Type: Peripheral, IV Location: L Antecubital  Venofer (Iron Sucrose), Dose: 300 mg  Infusion Start Time: 1336  Infusion Stop Time: 1520  Post Infusion IV Care: Peripheral IV Discontinued  Discharge: Condition: Good, Destination: Home . AVS provided to patient.   Performed by:  Loney Hering, LPN

## 2022-04-14 ENCOUNTER — Ambulatory Visit (INDEPENDENT_AMBULATORY_CARE_PROVIDER_SITE_OTHER): Payer: No Typology Code available for payment source

## 2022-04-14 VITALS — BP 123/74 | HR 78 | Temp 98.3°F | Resp 18 | Ht 64.0 in | Wt 127.2 lb

## 2022-04-14 DIAGNOSIS — D539 Nutritional anemia, unspecified: Secondary | ICD-10-CM | POA: Diagnosis not present

## 2022-04-14 DIAGNOSIS — D500 Iron deficiency anemia secondary to blood loss (chronic): Secondary | ICD-10-CM

## 2022-04-14 MED ORDER — SODIUM CHLORIDE 0.9 % IV SOLN
300.0000 mg | INTRAVENOUS | Status: DC
  Administered 2022-04-14: 300 mg via INTRAVENOUS
  Filled 2022-04-14: qty 15

## 2022-04-14 NOTE — Progress Notes (Signed)
Diagnosis: Iron Deficiency Anemia  Provider:  Chilton Greathouse MD  Procedure: Infusion  IV Type: Peripheral, IV Location: L Antecubital  Venofer (Iron Sucrose), Dose: 300 mg  Infusion Start Time: 1359  Infusion Stop Time: 1540  Post Infusion IV Care: Peripheral IV Discontinued  Discharge: Condition: Good, Destination: Home . AVS provided to patient.   Performed by:  Adriana Mccallum, RN

## 2022-04-20 ENCOUNTER — Encounter: Payer: Self-pay | Admitting: Internal Medicine

## 2022-09-08 ENCOUNTER — Other Ambulatory Visit (HOSPITAL_COMMUNITY): Payer: Self-pay | Admitting: Physician Assistant

## 2022-09-08 DIAGNOSIS — R2242 Localized swelling, mass and lump, left lower limb: Secondary | ICD-10-CM

## 2022-09-22 ENCOUNTER — Encounter: Payer: Self-pay | Admitting: Internal Medicine

## 2022-09-23 ENCOUNTER — Ambulatory Visit (HOSPITAL_BASED_OUTPATIENT_CLINIC_OR_DEPARTMENT_OTHER)
Admission: RE | Admit: 2022-09-23 | Discharge: 2022-09-23 | Disposition: A | Discharge status: Home / Self Care | Payer: No Typology Code available for payment source | Source: Ambulatory Visit | Attending: Physician Assistant | Admitting: Physician Assistant

## 2022-09-23 DIAGNOSIS — R2242 Localized swelling, mass and lump, left lower limb: Secondary | ICD-10-CM | POA: Diagnosis present

## 2022-09-23 MED ORDER — GADOBUTROL 1 MMOL/ML IV SOLN
5.7000 mL | Freq: Once | INTRAVENOUS | Status: AC | PRN
  Administered 2022-09-23: 5.7 mL via INTRAVENOUS
  Filled 2022-09-23: qty 6
